# Patient Record
Sex: Male | Born: 1990 | Race: Black or African American | Hispanic: No | Marital: Married | Smoking: Former smoker
Health system: Southern US, Community
[De-identification: ages and names within clinical notes are randomized; demographics above are authoritative.]

## PROBLEM LIST (undated history)

## (undated) DIAGNOSIS — J45909 Unspecified asthma, uncomplicated: Secondary | ICD-10-CM

## (undated) DIAGNOSIS — K219 Gastro-esophageal reflux disease without esophagitis: Secondary | ICD-10-CM

## (undated) DIAGNOSIS — M549 Dorsalgia, unspecified: Secondary | ICD-10-CM

## (undated) DIAGNOSIS — F32A Depression, unspecified: Secondary | ICD-10-CM

## (undated) HISTORY — DX: Depression, unspecified: F32.A

## (undated) HISTORY — PX: EYE SURGERY: SHX253

## (undated) HISTORY — DX: Gastro-esophageal reflux disease without esophagitis: K21.9

---

## 2016-03-02 ENCOUNTER — Encounter (HOSPITAL_COMMUNITY): Payer: Self-pay | Admitting: Emergency Medicine

## 2016-03-02 ENCOUNTER — Emergency Department (HOSPITAL_COMMUNITY)
Admission: EM | Admit: 2016-03-02 | Discharge: 2016-03-02 | Disposition: A | Discharge status: Home / Self Care | Payer: Self-pay | Attending: Emergency Medicine | Admitting: Emergency Medicine

## 2016-03-02 DIAGNOSIS — F17200 Nicotine dependence, unspecified, uncomplicated: Secondary | ICD-10-CM | POA: Insufficient documentation

## 2016-03-02 DIAGNOSIS — J45909 Unspecified asthma, uncomplicated: Secondary | ICD-10-CM | POA: Insufficient documentation

## 2016-03-02 DIAGNOSIS — J020 Streptococcal pharyngitis: Secondary | ICD-10-CM | POA: Insufficient documentation

## 2016-03-02 HISTORY — DX: Unspecified asthma, uncomplicated: J45.909

## 2016-03-02 LAB — RAPID STREP SCREEN (MED CTR MEBANE ONLY): Streptococcus, Group A Screen (Direct): POSITIVE — AB

## 2016-03-02 MED ORDER — IBUPROFEN 400 MG PO TABS
800.0000 mg | ORAL_TABLET | Freq: Once | ORAL | Status: AC
  Administered 2016-03-02: 800 mg via ORAL
  Filled 2016-03-02: qty 2

## 2016-03-02 MED ORDER — PENICILLIN V POTASSIUM 500 MG PO TABS: 500.0000 mg | ORAL_TABLET | Freq: Two times a day (BID) | ORAL | 0 refills | Status: DC

## 2016-03-02 MED ORDER — NAPROXEN 250 MG PO TABS: 250.0000 mg | ORAL_TABLET | Freq: Two times a day (BID) | ORAL | 0 refills | Status: DC

## 2016-03-02 NOTE — ED Provider Notes (Signed)
[facility]-EMERGENCY DEPT Provider Note   CSN: [id number] Arrival date & time: 03/02/16  0854 10:28 AM  First Provider Contact: 9:13 AM   By signing my name below, I, Charline Bills, attest that this documentation has been prepared under the direction and in the presence of Everlene Farrier, PA-C Electronically Signed: Charline Bills, ED Scribe 03/02/2016 at 9:49 AM.  History   Chief Complaint Chief Complaint  Patient presents with  . Sore Throat  . Otalgia  . Migraine   HPI Brett Henry is a 25 y.o. male who presents to the Emergency Department complaining of gradually worsening sore throat onset yesterday. Pt reports increased pain with swallowing. He reports associated symptoms of chills and bilateral ear pain. No treatments tried PTA. Pt denies nasal congestion, cough, postnasal drip, difficulty breathing, trouble swallowing. Pt states that he was diagnosed with strep throat 4 months ago. No recent antibiotic use.    The history is provided by the patient. No language interpreter was used.  Sore Throat  Pertinent negatives include no shortness of breath.   Past Medical History:  Diagnosis Date  . Asthma     There are no active problems to display for this patient.   No past surgical history on file.   Home Medications    Prior to Admission medications   Medication Sig Start Date End Date Taking? Authorizing Provider  naproxen (NAPROSYN) 250 MG tablet Take 1 tablet (250 mg total) by mouth 2 (two) times daily with a meal. 03/02/16   Everlene Farrier, PA-C  penicillin v potassium (VEETID) 500 MG tablet Take 1 tablet (500 mg total) by mouth 2 (two) times daily. 03/02/16   Everlene Farrier, PA-C    Family History No family history on file.  Social History Social History  Substance Use Topics  . Smoking status: Current Every Day Smoker  . Smokeless tobacco: Current User  . Alcohol use Yes   Allergies   Review of patient's allergies indicates not on file.  Review of  Systems Review of Systems  Constitutional: Positive for chills. Negative for fever.  HENT: Positive for ear pain and sore throat. Negative for congestion, drooling, postnasal drip, rhinorrhea, trouble swallowing and voice change.   Eyes: Negative for visual disturbance.  Respiratory: Negative for cough and shortness of breath.   Musculoskeletal: Negative for neck pain and neck stiffness.  Skin: Negative for rash.   Physical Exam Updated Vital Signs BP 110/66 (BP Location: Right Arm)   Pulse 89   Temp 99.9 F (37.7 C) (Oral)   Resp 16   Ht  (1.676 m)   Wt 63.6 kg   SpO2 100%   BMI 22.65 kg/m   Physical Exam  Constitutional: He appears well-developed and well-nourished. No distress.  Nontoxic appearing.  HENT:  Head: Normocephalic and atraumatic.  Right Ear: External ear normal. A middle ear effusion is present.  Left Ear: External ear normal. A middle ear effusion is present.  Mouth/Throat: Uvula is midline. No trismus in the jaw. Oropharyngeal exudate present.  Mild bilateral tonsillar hypertrophy without exudates. Uvula is midline without edema. No drooling. No trismus. No peritonsillar abscess. Tongue protrusion normal Speech is clear and coherent. Mild middle ear effusion bilaterally. No TM erythema or loss of landmarks.   Eyes: Conjunctivae are normal. Pupils are equal, round, and reactive to light. Right eye exhibits no discharge. Left eye exhibits no discharge.  Neck: Normal range of motion. Neck supple. No JVD present. No tracheal deviation present.  Cardiovascular: Normal rate, regular  rhythm, normal heart sounds and intact distal pulses.   Pulmonary/Chest: Effort normal and breath sounds normal. No stridor. No respiratory distress.  Abdominal: Soft. There is no tenderness.  Lymphadenopathy:    He has cervical adenopathy (mild).  Neurological: He is alert. Coordination normal.  Skin: Skin is warm and dry. No rash noted. He is not diaphoretic.  Psychiatric: He  has a normal mood and affect. His behavior is normal.  Nursing note and vitals reviewed.  ED Treatments / Results  Labs (all labs ordered are listed, but only abnormal results are displayed) Labs Reviewed  RAPID STREP SCREEN (NOT AT [facility]) - Abnormal; Notable for the following:       Result Value   Streptococcus, Group A Screen (Direct) POSITIVE (*)    All other components within normal limits   EKG  EKG Interpretation None       Radiology No results found.  Procedures Procedures (including critical care time) DIAGNOSTIC STUDIES: Oxygen Saturation is 100% on RA, normal by my interpretation.    COORDINATION OF CARE: 9:22 AM-Discussed treatment plan which includes strep screen and ibuprofen with pt at bedside and pt agreed to plan.   Medications Ordered in ED Medications  ibuprofen (ADVIL,MOTRIN) tablet 800 mg (800 mg Oral Given 03/02/16 0949)    Initial Impression / Assessment and Plan / ED Course  I have reviewed the triage vital signs and the nursing notes.  Pertinent labs & imaging results that were available during my care of the patient were reviewed by me and considered in my medical decision making (see chart for details).  Clinical Course   On exam patient is afebrile and nontoxic appearing. He has mild bilateral tonsillar hypertrophy without exudates. No peritonsillar abscess. No trismus no drooling. He is tolerating by mouth without difficulty. Strep test is positive. Will treat with antibiotics per patient requested oral antibiotics. Would she with Penicillin VK and naproxen. I discussed return precautions. I advised the patient to follow-up with their primary care provider this week. I advised the patient to return to the emergency department with new or worsening symptoms or new concerns. The patient verbalized understanding and agreement with plan.    I personally performed the services described in this documentation, which was scribed in my presence. The  recorded information has been reviewed and is accurate.      Final Clinical Impressions(s) / ED Diagnoses   Final diagnoses:  Strep pharyngitis    New Prescriptions Discharge Medication List as of 03/02/2016  9:50 AM    START taking these medications   Details  naproxen (NAPROSYN) 250 MG tablet Take 1 tablet (250 mg total) by mouth 2 (two) times daily with a meal., Starting Mon 03/02/2016, Print    penicillin v potassium (VEETID) 500 MG tablet Take 1 tablet (500 mg total) by mouth 2 (two) times daily., Starting Mon 03/02/2016, Print         Everlene Farrier, PA-C 03/02/16 1029    Rolan Bucco, MD 03/02/16 1531

## 2016-03-02 NOTE — ED Triage Notes (Signed)
Pt. Stated, I have a sore throat and earache since yesterday.

## 2016-03-31 ENCOUNTER — Encounter (HOSPITAL_COMMUNITY): Payer: Self-pay | Admitting: Emergency Medicine

## 2016-03-31 ENCOUNTER — Ambulatory Visit (HOSPITAL_COMMUNITY)
Admission: EM | Admit: 2016-03-31 | Discharge: 2016-03-31 | Disposition: A | Discharge status: Home / Self Care | Payer: Self-pay | Attending: Emergency Medicine | Admitting: Emergency Medicine

## 2016-03-31 DIAGNOSIS — R1084 Generalized abdominal pain: Secondary | ICD-10-CM

## 2016-03-31 DIAGNOSIS — T679XXA Effect of heat and light, unspecified, initial encounter: Secondary | ICD-10-CM

## 2016-03-31 DIAGNOSIS — R112 Nausea with vomiting, unspecified: Secondary | ICD-10-CM

## 2016-03-31 LAB — POCT I-STAT, CHEM 8
BUN: 9 mg/dL (ref 6–20)
CALCIUM ION: 1.19 mmol/L (ref 1.13–1.30)
Chloride: 104 mmol/L (ref 101–111)
Creatinine, Ser: 1 mg/dL (ref 0.61–1.24)
Glucose, Bld: 88 mg/dL (ref 65–99)
HEMATOCRIT: 44 % (ref 39.0–52.0)
HEMOGLOBIN: 15 g/dL (ref 13.0–17.0)
POTASSIUM: 4.3 mmol/L (ref 3.5–5.1)
Sodium: 141 mmol/L (ref 135–145)
TCO2: 24 mmol/L (ref 0–100)

## 2016-03-31 MED ORDER — ONDANSETRON HCL 4 MG/2ML IJ SOLN
4.0000 mg | Freq: Once | INTRAMUSCULAR | Status: AC
  Administered 2016-03-31: 4 mg via INTRAMUSCULAR

## 2016-03-31 MED ORDER — ONDANSETRON HCL 4 MG PO TABS: 4.0000 mg | ORAL_TABLET | Freq: Four times a day (QID) | ORAL | 0 refills | Status: DC

## 2016-03-31 MED ORDER — ONDANSETRON HCL 4 MG/2ML IJ SOLN
INTRAMUSCULAR | Status: AC
  Filled 2016-03-31: qty 2

## 2016-03-31 NOTE — ED Provider Notes (Signed)
CSN: [id number]     Arrival date & time 03/31/16  1553 History   First MD Initiated Contact with Patient 03/31/16 1630     Chief Complaint  Patient presents with  . 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 Over the next 3 hours he developed some dizziness as well. He vomited a second time around 12:30. No vomiting since. He is complaining of abdominal pain, intermittent dizziness and nausea and vomiting. He states he attempted to drink some water prior to coming to the urgent care but could not hold down despite the fact that he said he had no vomiting at that time.      Past Medical History:  Diagnosis Date  . Asthma    Past Surgical History:  Procedure Laterality Date  . EYE SURGERY     History reviewed. No pertinent family history. Social History  Substance Use Topics  . Smoking status: Current Every Day Smoker    Packs/day: 1.00    Types: Cigarettes  . Smokeless tobacco: Current User  . Alcohol use Yes     Comment: occasional    Review of Systems  Constitutional: Positive for activity change and fatigue. Negative for fever.  HENT: Negative.   Eyes: Negative.   Respiratory: Negative for cough, shortness of breath and wheezing.   Cardiovascular: Negative for chest pain.  Gastrointestinal: Positive for abdominal pain.       Patient draws a circle around the entire mid abdomen indicating the area of abdominal discomfort.  Genitourinary: Negative.   Musculoskeletal: Negative.   Neurological: Negative.   All other systems reviewed and are negative.   Allergies  Tylenol [acetaminophen]  Home Medications   Prior to Admission medications   Medication Sig Start Date End Date Taking? Authorizing Provider  naproxen (NAPROSYN) 250 MG tablet Take 1 tablet (250 mg total) by mouth  2 (two) times daily with a meal. 03/02/16   Everlene FarrierWilliam Dansie, PA-C  ondansetron (ZOFRAN) 4 MG tablet Take 1 tablet (4 mg total) by mouth every 6 (six) hours. 03/31/16   Hayden Rasmussenavid Cheyan Frees, NP   Meds Ordered and Administered this Visit   Medications  ondansetron [facility](ZOFRAN) injection 4 mg (4 mg Intramuscular Given 03/31/16 1703)    BP 125/67 (BP Location: Left Arm)   Pulse 69   Temp 98.6 F (37 C) (Oral)   Resp 16   Ht 5\' 6"  (1.676 m)   Wt 135 lb (61.2 kg)   SpO2 99%   BMI 21.79 kg/m  No data found.   Physical Exam  Constitutional: He is oriented to person, place, and time. He appears well-developed and well-nourished. No distress.  Eyes: EOM are normal.  Neck: Normal range of motion.  Cardiovascular: Normal rate, regular rhythm and normal heart sounds.   Pulmonary/Chest: Effort normal and breath sounds normal. No respiratory distress. He has no wheezes. He has no rales.  Abdominal: Soft. Bowel sounds are normal. He exhibits no distension and no mass. There is no tenderness. There is no rebound.  No tenderness however the patient tends to contract his abdominal muscles during the exam. Palpation of the muscles themselves produces abdominal wall pain.  Musculoskeletal: Normal range of motion.  Lymphadenopathy:    He has no cervical adenopathy.  Neurological: He is alert and oriented to person, place, and time.  Skin: Skin is warm and dry. No rash noted.  Psychiatric: He has a normal mood and affect.  Nursing note and vitals reviewed.   Urgent Care Course   Clinical Course   Results for orders placed or performed during the hospital encounter of 03/31/16  I-STAT, chem 8  Result Value Ref Range   Sodium 141 135 - 145 mmol/L   Potassium 4.3 3.5 - 5.1 mmol/L   Chloride 104 101 - 111 mmol/L   BUN 9 6 - 20 mg/dL   Creatinine, Ser 4.091.00 0.61 - 1.24 mg/dL   Glucose, Bld 88 65 - 99 mg/dL   Calcium, Ion 8.111.19 9.141.13 - 1.30 mmol/L   TCO2 24 0 - 100 mmol/L   Hemoglobin 15.0 13.0 - 17.0 g/dL    HCT 78.244.0 95.639.0 - 21.352.0 %    Procedures (including critical care time)  Labs Review Labs Reviewed  POCT I-STAT, CHEM 8    Imaging Review No results found.   Visual Acuity Review  Right Eye Distance:   Left Eye Distance:   Bilateral Distance:    Right Eye Near:   Left Eye Near:    Bilateral Near:         MDM   1. Heat effect, initial encounter   2. Non-intractable vomiting with nausea, vomiting of unspecified type   3. Generalized abdominal pain    Stain: Environment. Drink lots of fluids, add Gatorade or Pedialyte. Zofran as needed for nausea and vomiting. Clear liquids for the next 12 hours or so. Recheck promptly if worse. Follow-up with your primary care doctor. Meds ordered this encounter  Medications  . ondansetron (ZOFRAN) injection 4 mg  . ondansetron (ZOFRAN) 4 MG tablet    Sig: Take 1 tablet (4 mg total) by mouth every 6 (six) hours.    Dispense:  12 tablet    Refill:  0    Order Specific Question:   Supervising Provider    Answer:   Linna HoffKINDL, JAMES D [[id number]]       Hayden Rasmussenavid Timofey Carandang, NP 03/31/16 1735

## 2016-03-31 NOTE — Discharge Instructions (Signed)
Lots of water and clear liquids, stay well hydrated.Add Gatorade or Pedialyte to drink.

## 2016-03-31 NOTE — ED Triage Notes (Signed)
Pt reports two episodes of vomiting at work today. He works in a [employer], but states he was drinking water.  He still feels nauseous and has some stomach cramping.

## 2016-04-09 ENCOUNTER — Emergency Department (HOSPITAL_COMMUNITY): Payer: Self-pay

## 2016-04-09 ENCOUNTER — Encounter (HOSPITAL_COMMUNITY): Payer: Self-pay

## 2016-04-09 ENCOUNTER — Emergency Department (HOSPITAL_COMMUNITY)
Admission: EM | Admit: 2016-04-09 | Discharge: 2016-04-09 | Disposition: A | Discharge status: Home / Self Care | Payer: Self-pay | Attending: Emergency Medicine | Admitting: Emergency Medicine

## 2016-04-09 DIAGNOSIS — X500XXA Overexertion from strenuous movement or load, initial encounter: Secondary | ICD-10-CM | POA: Insufficient documentation

## 2016-04-09 DIAGNOSIS — Y9259 Other trade areas as the place of occurrence of the external cause: Secondary | ICD-10-CM | POA: Insufficient documentation

## 2016-04-09 DIAGNOSIS — Y990 Civilian activity done for income or pay: Secondary | ICD-10-CM | POA: Insufficient documentation

## 2016-04-09 DIAGNOSIS — F17210 Nicotine dependence, cigarettes, uncomplicated: Secondary | ICD-10-CM | POA: Insufficient documentation

## 2016-04-09 DIAGNOSIS — Y9389 Activity, other specified: Secondary | ICD-10-CM | POA: Insufficient documentation

## 2016-04-09 DIAGNOSIS — M25561 Pain in right knee: Secondary | ICD-10-CM | POA: Insufficient documentation

## 2016-04-09 DIAGNOSIS — J45909 Unspecified asthma, uncomplicated: Secondary | ICD-10-CM | POA: Insufficient documentation

## 2016-04-09 MED ORDER — NAPROXEN 500 MG PO TABS: 500.0000 mg | ORAL_TABLET | Freq: Two times a day (BID) | ORAL | 0 refills | Status: DC

## 2016-04-09 MED ORDER — KETOROLAC TROMETHAMINE 30 MG/ML IJ SOLN
30.0000 mg | Freq: Once | INTRAMUSCULAR | Status: AC
  Administered 2016-04-09: 30 mg via INTRAMUSCULAR
  Filled 2016-04-09: qty 1

## 2016-04-09 NOTE — ED Triage Notes (Signed)
Pt. sts that his leg started hurting yesterday at work and is sore and throbbing on the outer back part of the knee. Pt. Has full range of motion but it is painful

## 2016-04-09 NOTE — ED Notes (Signed)
Pt. Leaving with significant other in minimal pain and denies any more questions

## 2016-04-09 NOTE — Discharge Instructions (Signed)
Your x-ray was normal today. Take naproxen as needed for pain. Keep your knee elevated and ice on and off for the next 48 hrs. Return to work next week as scheduled. Follow up with ortho as needed.

## 2016-04-09 NOTE — ED Provider Notes (Signed)
[facility] DEPT Provider Note   CSN: [id number] Arrival date & time: 04/09/16  4098  By signing my name below, I, Brett Henry, attest that this documentation has been prepared under the direction and in the presence of Lorenzo Arscott, [place]. Electronically Signed: Christel Henry, Scribe. 04/09/2016. 9:38 AM.    History   Chief Complaint Chief Complaint  Patient presents with  . Leg Pain    The history is provided by the patient. No language interpreter was used.   HPI Comments:  Brett Henry is a 25 y.o. male who presents to the Emergency Department complaining of sudden onset, constant leg pain beginning at 4:30PM yesterday. Pt reports that yesterday at work, he felt a "sore, sharp and throbbing" pain the posterolateral  aspect of R knee particularly lateral side. Pt describes the pain as 7/10. Pt states that his R knee becomes weak when walking. 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 Pt denies numbness or weakness. Has not tried anything for his pain.  Past Medical History:  Diagnosis Date  . Asthma     There are no active problems to display for this patient.  Past Surgical History:  Procedure Laterality Date  . EYE SURGERY      Home Medications    Prior to Admission medications   Medication Sig Start Date End Date Taking? Authorizing Provider  naproxen (NAPROSYN) 250 MG tablet Take 1 tablet (250 mg total) by mouth 2 (two) times daily with a meal. 03/02/16   Everlene Farrier, PA-C  ondansetron (ZOFRAN) 4 MG tablet Take 1 tablet (4 mg total) by mouth every 6 (six) hours. 03/31/16   Hayden Rasmussen, NP   Family History No family history on file.  Social History Social History  Substance Use Topics  . Smoking status: Current Every Day Smoker    Packs/day: 1.00    Types: Cigarettes  . Smokeless tobacco: Current User  . Alcohol use Yes     Comment: occasional   Allergies   Tylenol [acetaminophen]  Review of Systems Review of Systems 10 Systems  reviewed and are negative for acute change except as noted in the HPI.  Physical Exam Updated Vital Signs BP 122/69 (BP Location: Right Arm)   Pulse 74   Temp 98.2 F (36.8 C) (Oral)   Resp 19   Ht 5\' 6"  (1.676 m)   Wt 140 lb (63.5 kg)   SpO2 100%   BMI 22.60 kg/m   Physical Exam  Constitutional: He appears well-developed and well-nourished. No distress.  HENT:  Head: Normocephalic and atraumatic.  Eyes: Conjunctivae are normal.  Cardiovascular: Normal rate.   Pulmonary/Chest: Effort normal.  Abdominal: He exhibits no distension.  Musculoskeletal:  Right lateral knee point tender to palpation. No edema or discoloration. FROM of knee. No laxity or crepitus. Ambulatory with steady gait. 2+ DP, and PT  Neurological: He is alert.  Skin: Skin is warm and dry.  Psychiatric: He has a normal mood and affect.  Nursing note and vitals reviewed.    ED Treatments / Results  DIAGNOSTIC STUDIES:  Oxygen Saturation is 100% on RA, normal by my interpretation.    COORDINATION OF CARE:  9:38 AM Will give injection for knee pain. Discussed treatment plan with pt at bedside and pt agreed to plan.  Labs (all labs ordered are listed, but only abnormal results are displayed) Labs Reviewed - No data to display  EKG  EKG Interpretation None       Radiology Dg Knee Complete 4 Views  Right  Result Date: 04/09/2016 CLINICAL DATA:  Knee pain.  No known injury.  Initial evaluation EXAM: RIGHT KNEE - COMPLETE 4+ VIEW COMPARISON:  No recent prior . FINDINGS: No acute bony or joint abnormality identified. No evidence of fracture dislocation. IMPRESSION: No acute or focal abnormality. Electronically Signed   By: Maisie Fushomas  Register   On: 04/09/2016 10:18    Procedures Procedures (including critical care time)  Medications Ordered in ED Medications  ketorolac (TORADOL) 30 MG/ML injection 30 mg (30 mg Intramuscular Given 04/09/16 0954)     Initial Impression / Assessment and Plan / ED  Course   I have reviewed the triage vital signs and the nursing notes.  Pertinent labs & imaging results that were available during my care of the patient were reviewed by me and considered in my medical decision making (see chart for details).  Clinical Course   Pt requesting x-ray. Suspect overuse injury. Will treat with toradol.   XR negative. Doubt septic joint without fever, erythema, or edema. Rx given for naproxen. Encouraged RICE therapy. F/u with ortho as needed. ER return precautions given.  Final Clinical Impressions(s) / ED Diagnoses   Final diagnoses:  Right knee pain    New Prescriptions Discharge Medication List as of 04/09/2016 10:26 AM    START taking these medications   Details  !! naproxen (NAPROSYN) 500 MG tablet Take 1 tablet (500 mg total) by mouth 2 (two) times daily., Starting Thu 04/09/2016, Print     !! - Potential duplicate medications found. Please discuss with provider.     I personally performed the services described in this documentation, which was scribed in my presence. The recorded information has been reviewed and is accurate.   Carlene CoriaSerena Y Kc Summerson, PA-C 04/09/16 1337    Vanetta MuldersScott Zackowski, MD 04/10/16 [phone]

## 2016-04-09 NOTE — ED Notes (Signed)
Patient transported to X-ray 

## 2016-04-21 ENCOUNTER — Encounter (HOSPITAL_COMMUNITY): Payer: Self-pay | Admitting: Emergency Medicine

## 2016-04-21 ENCOUNTER — Ambulatory Visit (HOSPITAL_COMMUNITY)
Admission: EM | Admit: 2016-04-21 | Discharge: 2016-04-21 | Disposition: A | Discharge status: Home / Self Care | Payer: Medicaid Other | Attending: Family Medicine | Admitting: Family Medicine

## 2016-04-21 DIAGNOSIS — R197 Diarrhea, unspecified: Secondary | ICD-10-CM

## 2016-04-21 DIAGNOSIS — K529 Noninfective gastroenteritis and colitis, unspecified: Secondary | ICD-10-CM

## 2016-04-21 DIAGNOSIS — R112 Nausea with vomiting, unspecified: Secondary | ICD-10-CM

## 2016-04-21 MED ORDER — PROMETHAZINE HCL 25 MG PO TABS: 25.0000 mg | ORAL_TABLET | Freq: Four times a day (QID) | ORAL | 0 refills | Status: DC | PRN

## 2016-04-21 NOTE — Discharge Instructions (Signed)
Push fluids.  Gatorade etc.   Do not eat meal with lots of fats or grease

## 2016-04-21 NOTE — ED Triage Notes (Signed)
[facility] a [facility] with abdominal pain that has been intermittent for 3 days.

## 2016-04-21 NOTE — ED Provider Notes (Signed)
CSN: [id number]     Arrival date & time 04/21/16  1317 History   First MD Initiated Contact with Patient 04/21/16 1503     Chief Complaint  Patient presents with  . Abdominal Pain   (Consider location/radiation/quality/duration/timing/severity/associated sxs/prior Treatment) HPI 25 y/o male with onset of n/v/d Sunday after eating chicken wings. Vomiting & diarrhea 4-5 times a day. Used 1 zofran without change of symptoms.  Past Medical History:  Diagnosis Date  . Asthma    Past Surgical History:  Procedure Laterality Date  . EYE SURGERY     History reviewed. No pertinent family history. Social History  Substance Use Topics  . Smoking status: Current Every Day Smoker    Packs/day: 1.00    Types: Cigarettes  . Smokeless tobacco: Current User  . Alcohol use Yes     Comment: occasional    Review of Systems  Denies: HEADACHE,   CHEST PAIN, CONGESTION, DYSURIA, SHORTNESS OF BREATH  Allergies  Tylenol [acetaminophen]  Home Medications   Prior to Admission medications   Medication Sig Start Date End Date Taking? Authorizing Provider  ondansetron (ZOFRAN) 4 MG tablet Take 1 tablet (4 mg total) by mouth every 6 (six) hours. 03/31/16  Yes Hayden Rasmussenavid Mabe, NP  naproxen (NAPROSYN) 250 MG tablet Take 1 tablet (250 mg total) by mouth 2 (two) times daily with a meal. 03/02/16   Everlene FarrierWilliam Dansie, PA-C  naproxen (NAPROSYN) 500 MG tablet Take 1 tablet (500 mg total) by mouth 2 (two) times daily. 04/09/16   Carlene CoriaSerena Y Sam, PA-C  promethazine (PHENERGAN) 25 MG tablet Take 1 tablet (25 mg total) by mouth every 6 (six) hours as needed for nausea or vomiting. 04/21/16   Tharon AquasFrank C Dmarcus Decicco, PA   Meds Ordered and Administered this Visit  Medications - No data to display  BP 110/68 (BP Location: Left Arm)   Pulse (!) 57   Temp 98.2 F (36.8 C) (Oral)   Resp 16   SpO2 100%  No data found.   Physical Exam NURSES NOTES AND VITAL SIGNS REVIEWED. CONSTITUTIONAL: Well developed, well nourished, no acute  distress HEENT: normocephalic, atraumatic EYES: Conjunctiva normal NECK:normal ROM, supple, no adenopathy PULMONARY:No respiratory distress, normal effort ABDOMINAL: Soft, ND, NT BS+, No CVAT MUSCULOSKELETAL: Normal ROM of all extremities,  SKIN: warm and dry without rash PSYCHIATRIC: Mood and affect, behavior are normal  Urgent Care Course   Clinical Course    Procedures (including critical care time)  Labs Review Labs Reviewed - No data to display  Imaging Review No results found.   Visual Acuity Review  Right Eye Distance:   Left Eye Distance:   Bilateral Distance:    Right Eye Near:   Left Eye Near:    Bilateral Near:         MDM   1. Gastroenteritis   2. Nausea vomiting and diarrhea     Patient is reassured that there are no issues that require transfer to higher level of care at this time or additional tests. Patient is advised to continue home symptomatic treatment. Patient is advised that if there are new or worsening symptoms to attend the emergency department, contact primary care provider, or return to UC. Instructions of care provided discharged home in stable condition.    THIS NOTE WAS GENERATED USING A VOICE RECOGNITION SOFTWARE PROGRAM. ALL REASONABLE EFFORTS  WERE MADE TO PROOFREAD THIS DOCUMENT FOR ACCURACY.  I have verbally reviewed the discharge instructions with the patient. A printed AVS was given to  the patient.  All questions were answered prior to discharge.      Tharon Aquas, PA 04/21/16 2031

## 2016-10-30 ENCOUNTER — Emergency Department (HOSPITAL_COMMUNITY)
Admission: EM | Admit: 2016-10-30 | Discharge: 2016-10-30 | Disposition: A | Discharge status: Home / Self Care | Payer: Self-pay | Attending: Emergency Medicine | Admitting: Emergency Medicine

## 2016-10-30 ENCOUNTER — Emergency Department (HOSPITAL_COMMUNITY): Payer: Self-pay

## 2016-10-30 ENCOUNTER — Encounter (HOSPITAL_COMMUNITY): Payer: Self-pay | Admitting: Emergency Medicine

## 2016-10-30 DIAGNOSIS — Y939 Activity, unspecified: Secondary | ICD-10-CM | POA: Insufficient documentation

## 2016-10-30 DIAGNOSIS — Y929 Unspecified place or not applicable: Secondary | ICD-10-CM | POA: Insufficient documentation

## 2016-10-30 DIAGNOSIS — Z79899 Other long term (current) drug therapy: Secondary | ICD-10-CM | POA: Insufficient documentation

## 2016-10-30 DIAGNOSIS — J45909 Unspecified asthma, uncomplicated: Secondary | ICD-10-CM | POA: Insufficient documentation

## 2016-10-30 DIAGNOSIS — F17210 Nicotine dependence, cigarettes, uncomplicated: Secondary | ICD-10-CM | POA: Insufficient documentation

## 2016-10-30 DIAGNOSIS — X500XXA Overexertion from strenuous movement or load, initial encounter: Secondary | ICD-10-CM | POA: Insufficient documentation

## 2016-10-30 DIAGNOSIS — M25512 Pain in left shoulder: Secondary | ICD-10-CM | POA: Insufficient documentation

## 2016-10-30 DIAGNOSIS — Y990 Civilian activity done for income or pay: Secondary | ICD-10-CM | POA: Insufficient documentation

## 2016-10-30 IMAGING — DX DG SHOULDER 2+V*L*
3 series · 3 of 3 positions shown · non-contrast
Comparison: None.

CLINICAL DATA: Left shoulder pain for several days, no known
injury, initial encounter

EXAM:
LEFT SHOULDER - 2+ VIEW

[w shoulder internal left]
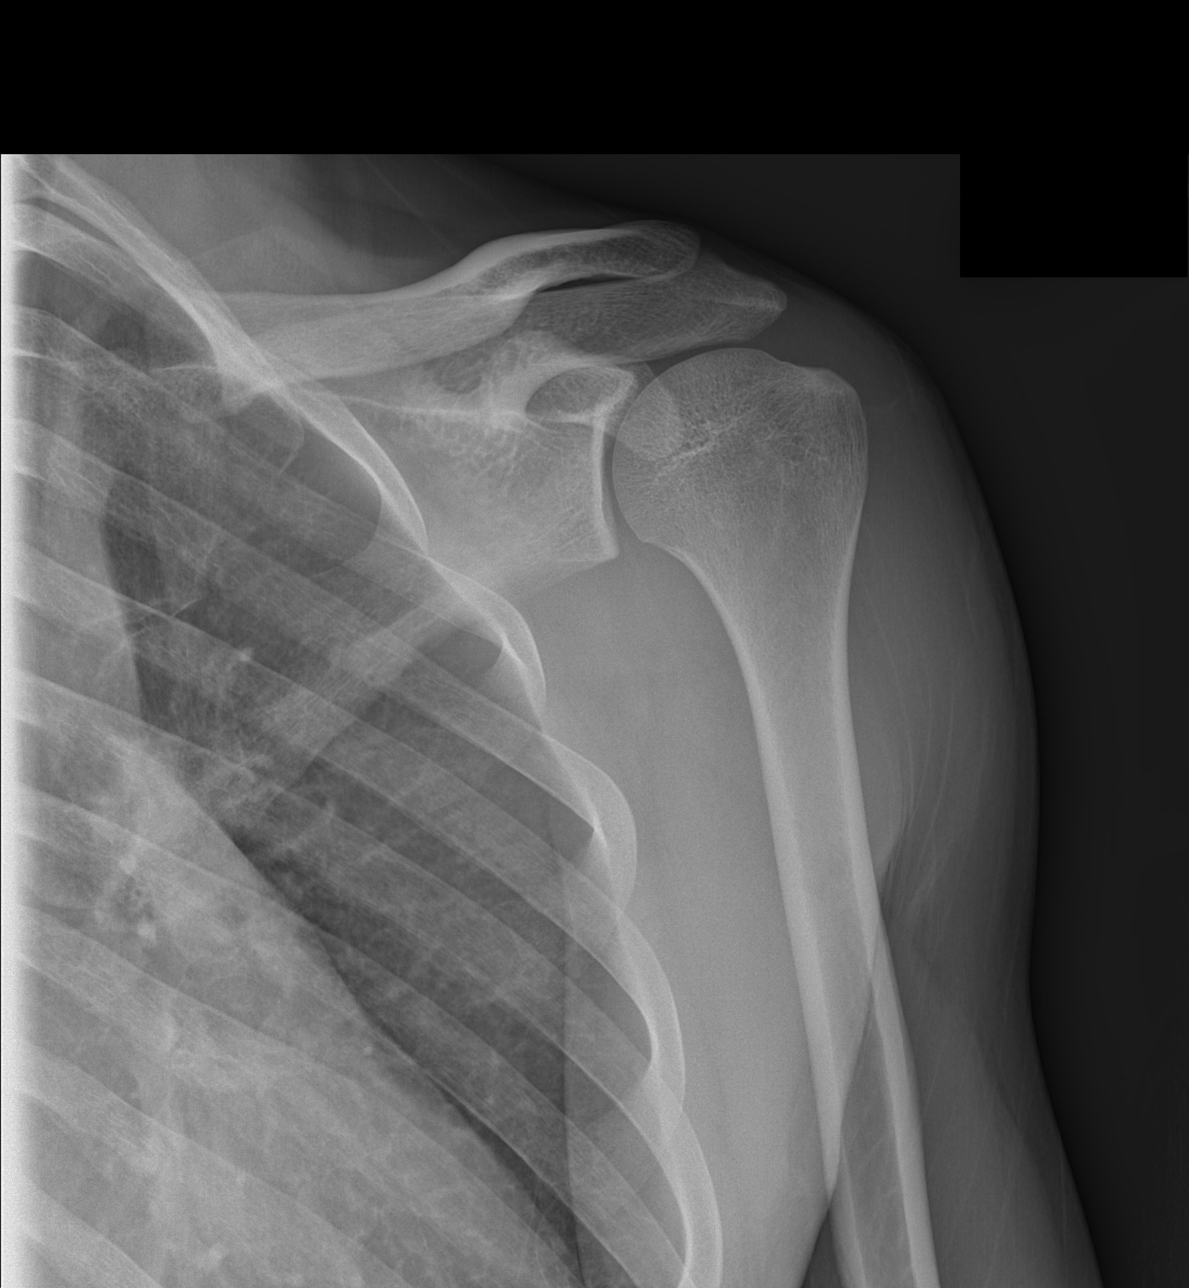

[w shoulder y-view left]
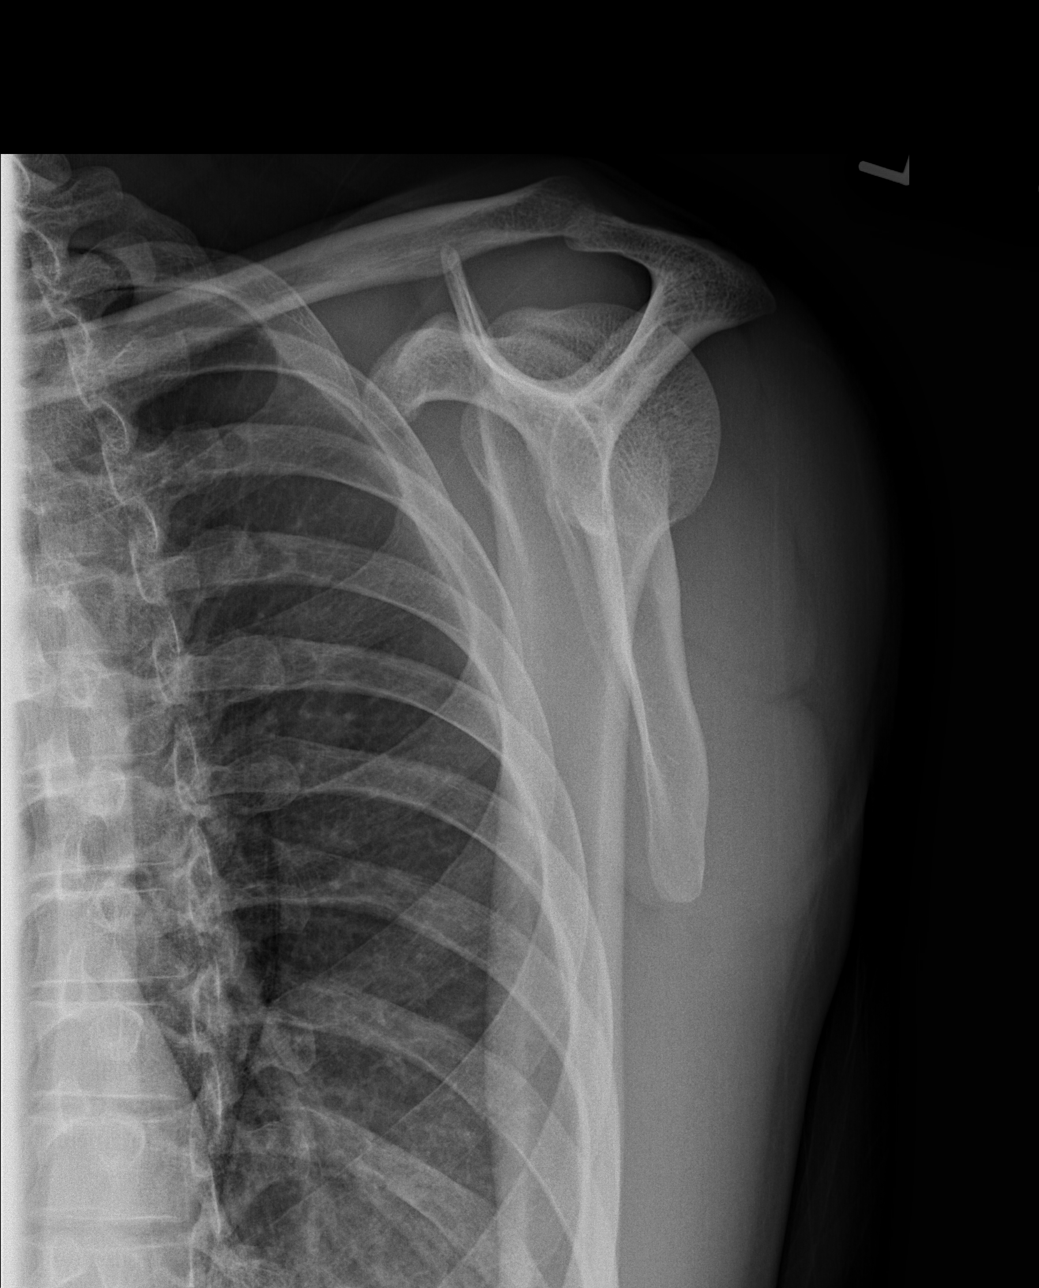

[x shoulder axillary left]
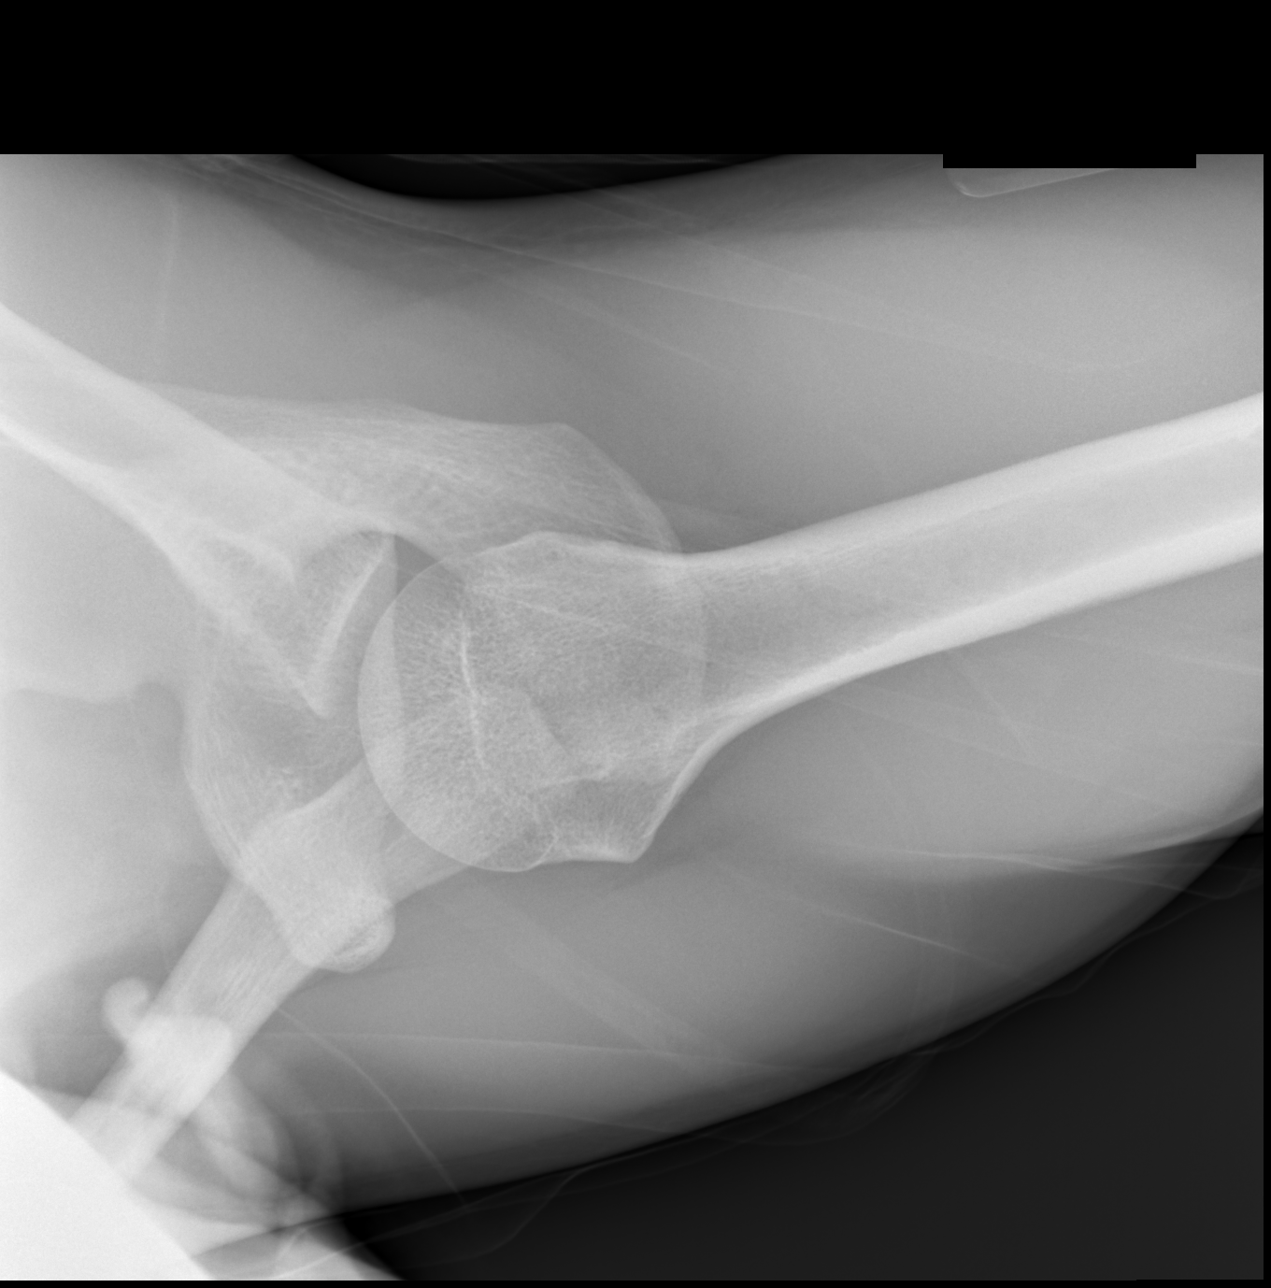

[3 of 3 positions shown; findings below may reference images not displayed]

FINDINGS: There is no evidence of fracture or dislocation. There is no
evidence of arthropathy or other focal bone abnormality. Soft
tissues are unremarkable.
IMPRESSION: No acute abnormality noted.

## 2016-10-30 MED ORDER — IBUPROFEN 400 MG PO TABS
600.0000 mg | ORAL_TABLET | Freq: Once | ORAL | Status: AC
  Administered 2016-10-30: 600 mg via ORAL
  Filled 2016-10-30: qty 1

## 2016-10-30 MED ORDER — IBUPROFEN 600 MG PO TABS: 600.0000 mg | ORAL_TABLET | Freq: Four times a day (QID) | ORAL | 0 refills | Status: DC | PRN

## 2016-10-30 NOTE — Discharge Instructions (Signed)
Take your medication as prescribed as needed for pain relief. I also recommend applying ice to affected area for 15-20 minutes 3-4 times daily. I recommend refraining from doing any overhead lifting or strenuous activity for the next few days until her symptoms have improved. I recommend following up with the orthopedic office of below if your symptoms have not improved over the next week. Return to the emergency department if symptoms worsen or new onset of fever, swelling, neck pain/stiffness, numbness, tingling, weakness.

## 2016-10-30 NOTE — ED Triage Notes (Signed)
Pt states his left shoulder has been hurting for 2 days. States he has limited motion due to pain. Pt cannot recall injuring it.

## 2016-10-30 NOTE — ED Provider Notes (Signed)
[facility]-EMERGENCY DEPT Provider Note   CSN: [id number] Arrival date & time: 10/30/16  [id number] By signing my name below, I, Bridgette HabermannMaria Tan, attest that this documentation has been prepared under the direction and in the presence of Melburn HakeNicole Lemma [place], [place]. Electronically Signed: Bridgette HabermannMaria Tan, ED Scribe. 10/30/16. 9:28 AM.  History   Chief Complaint Chief Complaint  Patient presents with  . Shoulder Pain    HPI The history is provided by the patient. No language interpreter was used.   HPI Comments: Lydia Guilesntonio Cramer is a 26 y.o. male with h/o asthma, who is right-hand dominant, presents to the Emergency Department complaining of left shoulder pain beginning 3 days ago. Pt reports pain is tingling/sharp in quality. He denies any injury or trauma to the area but notes he does a lot of heavy lifting/repetitive movement at work. Pt states that pain is exacerbated with movement and straightening his arm out. No noted treatments tried PTA. Pt denies fever, chills, neck pain/stiffness, swelling, rash, weakness, or any other associated symptoms.  Past Medical History:  Diagnosis Date  . Asthma     There are no active problems to display for this patient.   Past Surgical History:  Procedure Laterality Date  . EYE SURGERY         Home Medications    Prior to Admission medications   Medication Sig Start Date End Date Taking? Authorizing Provider  ibuprofen (ADVIL,MOTRIN) 600 MG tablet Take 1 tablet (600 mg total) by mouth every 6 (six) hours as needed. 10/30/16   Barrett HenleNicole Elizabeth Illyana Schorsch, PA-C  naproxen (NAPROSYN) 250 MG tablet Take 1 tablet (250 mg total) by mouth 2 (two) times daily with a meal. 03/02/16   Everlene FarrierWilliam Dansie, PA-C  naproxen (NAPROSYN) 500 MG tablet Take 1 tablet (500 mg total) by mouth 2 (two) times daily. 04/09/16   Ace GinsSerena Y Sam, PA-C  ondansetron (ZOFRAN) 4 MG tablet Take 1 tablet (4 mg total) by mouth every 6 (six) hours. 03/31/16   Hayden Rasmussenavid Mabe, NP  promethazine (PHENERGAN) 25 MG tablet  Take 1 tablet (25 mg total) by mouth every 6 (six) hours as needed for nausea or vomiting. 04/21/16   Tharon AquasFrank C Patrick, PA    Family History History reviewed. No pertinent family history.  Social History Social History  Substance Use Topics  . Smoking status: Current Every Day Smoker    Packs/day: 1.00    Types: Cigarettes  . Smokeless tobacco: Current User  . Alcohol use Yes     Comment: occasional     Allergies   Tylenol [acetaminophen]   Review of Systems Review of Systems  Constitutional: Negative for chills and fever.  Musculoskeletal: Positive for arthralgias. Negative for neck pain and neck stiffness.  Neurological: Negative for numbness.  All other systems reviewed and are negative.    Physical Exam Updated Vital Signs BP 117/64 (BP Location: Left Arm)   Pulse 63   Temp 98.6 F (37 C) (Oral)   Resp 15   SpO2 100%   Physical Exam  Constitutional: He is oriented to person, place, and time. He appears well-developed and well-nourished.  HENT:  Head: Normocephalic and atraumatic.  Eyes: Conjunctivae and EOM are normal. Right eye exhibits no discharge. Left eye exhibits no discharge. No scleral icterus.  Neck: Normal range of motion. Neck supple.  Cardiovascular: Normal rate, regular rhythm, normal heart sounds and intact distal pulses.   Pulmonary/Chest: Effort normal and breath sounds normal. No respiratory distress. He has no wheezes. He has no rales.  Abdominal: He exhibits no distension.  Musculoskeletal: Normal range of motion. He exhibits tenderness. He exhibits no edema.  No midline C, T, or L tenderness. Full range of motion of neck and back. Full range of motion of bilateral upper and lower extremities, with 5/5 strength. Sensation intact. 2+ radial and PT pulses. Cap refill <2 seconds. Patient able to stand and ambulate without assistance.  Mild tenderness over left deltoid. Full ROM over left shoulder, elbow, forearm, and hand with 5/5 strength. No  swelling, erythema, warmth, abrasion, or rash present.  Neurological: He is alert and oriented to person, place, and time.  Skin: Skin is warm and dry. Capillary refill takes less than 2 seconds. No rash noted. No erythema.  Psychiatric: He has a normal mood and affect. His behavior is normal.  Nursing note and vitals reviewed.    ED Treatments / Results  DIAGNOSTIC STUDIES: Oxygen Saturation is 100% on RA, normal by my interpretation.    COORDINATION OF CARE: 9:26 AM Discussed treatment plan with pt at bedside and pt agreed to plan.  Labs (all labs ordered are listed, but only abnormal results are displayed) Labs Reviewed - No data to display  EKG  EKG Interpretation None       Radiology Dg Shoulder Left  Result Date: 10/30/2016 CLINICAL DATA:  Left shoulder pain for several days, no known injury, initial encounter EXAM: LEFT SHOULDER - 2+ VIEW COMPARISON:  None. FINDINGS: There is no evidence of fracture or dislocation. There is no evidence of arthropathy or other focal bone abnormality. Soft tissues are unremarkable. IMPRESSION: No acute abnormality noted. Electronically Signed   By: Alcide Clever M.D.   On: 10/30/2016 09:10    Procedures Procedures (including critical care time)  Medications Ordered in ED Medications  ibuprofen (ADVIL,MOTRIN) tablet 600 mg (not administered)     Initial Impression / Assessment and Plan / ED Course  I have reviewed the triage vital signs and the nursing notes.  Pertinent labs & imaging results that were available during my care of the patient were reviewed by me and considered in my medical decision making (see chart for details).     Patient X-Ray negative for obvious fracture or dislocation. Pain managed in ED. Pt advised to follow up with orthopedics if symptoms persist for possibility of missed fracture diagnosis. Conservative therapy recommended and discussed. Patient will be dc home & is agreeable with above plan. Discussed  return precautions.  Final Clinical Impressions(s) / ED Diagnoses   Final diagnoses:  Acute pain of left shoulder    New Prescriptions New Prescriptions   IBUPROFEN (ADVIL,MOTRIN) 600 MG TABLET    Take 1 tablet (600 mg total) by mouth every 6 (six) hours as needed.   I personally performed the services described in this documentation, which was scribed in my presence. The recorded information has been reviewed and is accurate.     Satira Sark [place], [place] 10/30/16 1914    Melene Plan, DO 10/30/16 [phone]

## 2017-03-28 ENCOUNTER — Encounter (HOSPITAL_COMMUNITY): Payer: Self-pay | Admitting: *Deleted

## 2017-03-28 ENCOUNTER — Emergency Department (HOSPITAL_COMMUNITY)
Admission: EM | Admit: 2017-03-28 | Discharge: 2017-03-28 | Disposition: A | Discharge status: Home / Self Care | Payer: Medicaid Other | Attending: Emergency Medicine | Admitting: Emergency Medicine

## 2017-03-28 DIAGNOSIS — J45909 Unspecified asthma, uncomplicated: Secondary | ICD-10-CM | POA: Insufficient documentation

## 2017-03-28 DIAGNOSIS — J029 Acute pharyngitis, unspecified: Secondary | ICD-10-CM | POA: Insufficient documentation

## 2017-03-28 DIAGNOSIS — F17210 Nicotine dependence, cigarettes, uncomplicated: Secondary | ICD-10-CM | POA: Insufficient documentation

## 2017-03-28 LAB — RAPID STREP SCREEN (MED CTR MEBANE ONLY): STREPTOCOCCUS, GROUP A SCREEN (DIRECT): NEGATIVE

## 2017-03-28 MED ORDER — LIDOCAINE VISCOUS 2 % MT SOLN: 20.0000 mL | OROMUCOSAL | 0 refills | Status: DC | PRN

## 2017-03-28 MED ORDER — KETOROLAC TROMETHAMINE 30 MG/ML IJ SOLN
30.0000 mg | Freq: Once | INTRAMUSCULAR | Status: AC
  Administered 2017-03-28: 30 mg via INTRAMUSCULAR
  Filled 2017-03-28: qty 1

## 2017-03-28 MED ORDER — DEXAMETHASONE 10 MG/ML FOR PEDIATRIC ORAL USE: 10.0000 mg | Freq: Once | INTRAMUSCULAR | Status: DC

## 2017-03-28 MED ORDER — DEXAMETHASONE 4 MG PO TABS
10.0000 mg | ORAL_TABLET | Freq: Once | ORAL | Status: AC
  Administered 2017-03-28: 10 mg via ORAL
  Filled 2017-03-28: qty 3

## 2017-03-28 MED ORDER — PENICILLIN G BENZATHINE 1200000 UNIT/2ML IM SUSP
1.2000 10*6.[IU] | Freq: Once | INTRAMUSCULAR | Status: AC
  Administered 2017-03-28: 1.2 10*6.[IU] via INTRAMUSCULAR
  Filled 2017-03-28: qty 2

## 2017-03-28 NOTE — ED Triage Notes (Signed)
Sore throat since last pm, pain severe with swallowing

## 2017-03-28 NOTE — Discharge Instructions (Signed)
Has been treated for strep throat in the ED. Continue taking Motrin or ibuprofen at home every 4-6 hours. May use the lidocaine mouthwash to help numb the throat. If he develops any difficulty swallowing, worsening swelling, difficulties opening her mouth return to the ED.

## 2017-03-28 NOTE — ED Provider Notes (Signed)
[facility] Provider Note   CSN: [id number] Arrival date & time: 03/28/17  1009     History   Chief Complaint Chief Complaint  Patient presents with  . Sore Throat    HPI Brett Henry is a 26 y.o. male.  HPI 26 year old African-American male with no significant past medical history presents to the ED with complaints of sore throat. Patient states the pain started this a.m. Hurts to swallow. The patient is concerning may have strep throat. Denies any fevers. Able tolerate by mouth fluids. He has not tried nothing for his pain prior to arrival. Nothing makes better or worse. Denies any associated symptoms of fever, chills, URI symptoms, cough, neck stiffness, headache. Past Medical History:  Diagnosis Date  . Asthma     There are no active problems to display for this patient.   Past Surgical History:  Procedure Laterality Date  . EYE SURGERY         Home Medications    Prior to Admission medications   Medication Sig Start Date End Date Taking? Authorizing Provider  ibuprofen (ADVIL,MOTRIN) 600 MG tablet Take 1 tablet (600 mg total) by mouth every 6 (six) hours as needed. 10/30/16   Barrett Henle, PA-C  lidocaine (XYLOCAINE) 2 % solution Use as directed 20 mLs in the mouth or throat as needed for mouth pain. 03/28/17   Rise Mu, PA-C  naproxen (NAPROSYN) 250 MG tablet Take 1 tablet (250 mg total) by mouth 2 (two) times daily with a meal. 03/02/16   Everlene Farrier, PA-C  naproxen (NAPROSYN) 500 MG tablet Take 1 tablet (500 mg total) by mouth 2 (two) times daily. 04/09/16   Sam, Ace Gins, PA-C  ondansetron (ZOFRAN) 4 MG tablet Take 1 tablet (4 mg total) by mouth every 6 (six) hours. 03/31/16   Hayden Rasmussen, NP  promethazine (PHENERGAN) 25 MG tablet Take 1 tablet (25 mg total) by mouth every 6 (six) hours as needed for nausea or vomiting. 04/21/16   Tharon Aquas, PA    Family History No family history on file.  Social History Social  History  Substance Use Topics  . Smoking status: Current Every Day Smoker    Packs/day: 1.00    Types: Cigarettes  . Smokeless tobacco: Current User  . Alcohol use Yes     Comment: occasional     Allergies   Tylenol [acetaminophen]   Review of Systems Review of Systems  Constitutional: Negative for chills and fever.  HENT: Positive for sore throat. Negative for congestion and trouble swallowing.   Gastrointestinal: Negative for nausea and vomiting.  Musculoskeletal: Negative for neck pain and neck stiffness.     Physical Exam Updated Vital Signs BP 133/72 (BP Location: Left Arm)   Pulse 71   Temp 98.2 F (36.8 C) (Oral)   Resp 18   Ht 5\' 6"  (1.676 m)   Wt 68 kg (150 lb)   SpO2 100%   BMI 24.21 kg/m   Physical Exam  Constitutional: He appears well-developed and well-nourished. No distress.  HENT:  Head: Normocephalic and atraumatic.  Right Ear: Tympanic membrane, external ear and ear canal normal.  Left Ear: Tympanic membrane, external ear and ear canal normal.  Nose: No mucosal edema or rhinorrhea.  Mouth/Throat: Uvula is midline and mucous membranes are normal. No trismus in the jaw. No uvula swelling. Posterior oropharyngeal edema and posterior oropharyngeal erythema present. No oropharyngeal exudate or tonsillar abscesses. Tonsils are 2+ on the right. Tonsils are 2+ on the  left. No tonsillar exudate.  Eyes: Right eye exhibits no discharge. Left eye exhibits no discharge. No scleral icterus.  Neck: Normal range of motion. Neck supple.  Cardiovascular: Normal rate and regular rhythm.   Pulmonary/Chest: No respiratory distress.  Musculoskeletal: Normal range of motion.  Lymphadenopathy:    He has cervical adenopathy (bilatearl).  Neurological: He is alert.  Skin: No pallor.  Psychiatric: His behavior is normal. Judgment and thought content normal.  Nursing note and vitals reviewed.    ED Treatments / Results  Labs (all labs ordered are listed, but only  abnormal results are displayed) Labs Reviewed  RAPID STREP SCREEN (NOT AT [facility])  CULTURE, GROUP A STREP [facility])    EKG  EKG Interpretation None       Radiology No results found.  Procedures Procedures (including critical care time)  Medications Ordered in ED Medications  penicillin g benzathine (BICILLIN LA) 1200000 UNIT/2ML injection 1.2 Million Units (not administered)  ketorolac (TORADOL) 30 MG/ML injection 30 mg (not administered)  dexamethasone (DECADRON) tablet 10 mg (not administered)     Initial Impression / Assessment and Plan / ED Course  I have reviewed the triage vital signs and the nursing notes.  Pertinent labs & imaging results that were available during my care of the patient were reviewed by me and considered in my medical decision making (see chart for details).     Pt febrile with cervical lymphadenopathy, & dysphagia. Tonsils are edematous and erythematous without any exudate. Strep test was negative. However we'll treat given patient's presentation. Treated in the Ed with steroids, NSAIDs, Pain medication and PCN IM.  Pt appears mildly dehydrated, discussed importance of water rehydration. Presentation non concerning for PTA or infxn spread to soft tissue. No trismus or uvula deviation. Specific return precautions discussed. Pt able to drink water in ED without difficulty with intact air way. Recommended PCP follow up.    Final Clinical Impressions(s) / ED Diagnoses   Final diagnoses:  Sore throat    New Prescriptions New Prescriptions   LIDOCAINE (XYLOCAINE) 2 % SOLUTION    Use as directed 20 mLs in the mouth or throat as needed for mouth pain.     Rise Mu, PA-C 03/28/17 1206    Rolland Porter, MD 04/09/17 [phone]

## 2017-03-28 NOTE — ED Notes (Signed)
Declined W/C at D/C and was escorted to lobby by RN. 

## 2017-03-30 LAB — CULTURE, GROUP A STREP (THRC)

## 2018-01-15 ENCOUNTER — Other Ambulatory Visit: Payer: Self-pay

## 2018-01-15 ENCOUNTER — Emergency Department (HOSPITAL_COMMUNITY)
Admission: EM | Admit: 2018-01-15 | Discharge: 2018-01-15 | Disposition: A | Discharge status: Home / Self Care | Payer: No Typology Code available for payment source | Attending: Emergency Medicine | Admitting: Emergency Medicine

## 2018-01-15 ENCOUNTER — Emergency Department (HOSPITAL_COMMUNITY): Payer: No Typology Code available for payment source

## 2018-01-15 ENCOUNTER — Encounter (HOSPITAL_COMMUNITY): Payer: Self-pay | Admitting: Emergency Medicine

## 2018-01-15 DIAGNOSIS — M79622 Pain in left upper arm: Secondary | ICD-10-CM

## 2018-01-15 DIAGNOSIS — Y999 Unspecified external cause status: Secondary | ICD-10-CM | POA: Diagnosis not present

## 2018-01-15 DIAGNOSIS — M542 Cervicalgia: Secondary | ICD-10-CM

## 2018-01-15 DIAGNOSIS — F17210 Nicotine dependence, cigarettes, uncomplicated: Secondary | ICD-10-CM | POA: Insufficient documentation

## 2018-01-15 DIAGNOSIS — J45909 Unspecified asthma, uncomplicated: Secondary | ICD-10-CM | POA: Diagnosis not present

## 2018-01-15 DIAGNOSIS — Y9389 Activity, other specified: Secondary | ICD-10-CM | POA: Insufficient documentation

## 2018-01-15 DIAGNOSIS — Z23 Encounter for immunization: Secondary | ICD-10-CM | POA: Insufficient documentation

## 2018-01-15 DIAGNOSIS — Y92410 Unspecified street and highway as the place of occurrence of the external cause: Secondary | ICD-10-CM | POA: Diagnosis not present

## 2018-01-15 DIAGNOSIS — Z79899 Other long term (current) drug therapy: Secondary | ICD-10-CM | POA: Diagnosis not present

## 2018-01-15 DIAGNOSIS — M545 Low back pain, unspecified: Secondary | ICD-10-CM
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
## 2018-01-15 MED ORDER — IBUPROFEN 800 MG PO TABS
800.0000 mg | ORAL_TABLET | Freq: Once | ORAL | Status: AC
  Administered 2018-01-15: 800 mg via ORAL
  Filled 2018-01-15: qty 1

## 2018-01-15 MED ORDER — IBUPROFEN 600 MG PO TABS: 600.0000 mg | ORAL_TABLET | Freq: Four times a day (QID) | ORAL | 0 refills | Status: DC | PRN

## 2018-01-15 MED ORDER — TETANUS-DIPHTH-ACELL PERTUSSIS 5-2.5-18.5 LF-MCG/0.5 IM SUSP
0.5000 mL | Freq: Once | INTRAMUSCULAR | Status: AC
  Administered 2018-01-15: 0.5 mL via INTRAMUSCULAR
  Filled 2018-01-15: qty 0.5

## 2018-01-15 MED ORDER — CYCLOBENZAPRINE HCL 10 MG PO TABS: 10.0000 mg | ORAL_TABLET | Freq: Two times a day (BID) | ORAL | 0 refills | Status: DC | PRN

## 2018-01-15 MED ORDER — CYCLOBENZAPRINE HCL 10 MG PO TABS
10.0000 mg | ORAL_TABLET | Freq: Once | ORAL | Status: AC
  Administered 2018-01-15: 10 mg via ORAL
  Filled 2018-01-15: qty 1
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
## 2018-01-15 NOTE — Discharge Instructions (Signed)

## 2018-01-15 NOTE — ED Notes (Signed)
Pt and family understood dc material. NAD noted. Scripts and doctors excuse given at [facility]
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
## 2018-01-15 NOTE — ED Notes (Signed)
Greta DoomBowie, PA in to see patient and x-rays ordered.
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
## 2018-04-20 DIAGNOSIS — Z6820 Body mass index (BMI) 20.0-20.9, adult: Secondary | ICD-10-CM | POA: Diagnosis not present

## 2018-04-20 DIAGNOSIS — Z0000 Encounter for general adult medical examination without abnormal findings: Secondary | ICD-10-CM | POA: Diagnosis not present

## 2018-04-20 DIAGNOSIS — J45909 Unspecified asthma, uncomplicated: Secondary | ICD-10-CM | POA: Diagnosis not present

## 2019-01-04 ENCOUNTER — Ambulatory Visit (INDEPENDENT_AMBULATORY_CARE_PROVIDER_SITE_OTHER): Payer: BC Managed Care – PPO | Admitting: Adult Health

## 2019-01-04 ENCOUNTER — Encounter: Payer: Self-pay | Admitting: Adult Health

## 2019-01-04 ENCOUNTER — Other Ambulatory Visit: Payer: Self-pay

## 2019-01-04 DIAGNOSIS — J45990 Exercise induced bronchospasm: Secondary | ICD-10-CM | POA: Diagnosis not present

## 2019-01-04 DIAGNOSIS — Z7689 Persons encountering health services in other specified circumstances: Secondary | ICD-10-CM

## 2019-01-04 DIAGNOSIS — Z720 Tobacco use: Secondary | ICD-10-CM

## 2019-01-04 MED ORDER — ALBUTEROL SULFATE HFA 108 (90 BASE) MCG/ACT IN AERS: 2.0000 | INHALATION_SPRAY | Freq: Four times a day (QID) | RESPIRATORY_TRACT | 2 refills | Status: DC | PRN

## 2019-01-04 MED ORDER — VARENICLINE TARTRATE 0.5 MG X 11 & 1 MG X 42 PO MISC: ORAL | 0 refills | Status: DC

## 2019-01-04 NOTE — Progress Notes (Signed)
Virtual Visit via Video Note  I connected withAntonio Henry on 01/04/19 at  1:00 PM EDT by a video enabled telemedicine application and verified that I am speaking with the correct person using two identifiers.  Location patient: home Location provider:work or home office Persons participating in the virtual visit: patient, provider  I discussed the limitations of evaluation and management by telemedicine and the availability of in person appointments. The patient expressed understanding and agreed to proceed.   Patient presents to clinic today to establish care.  Acute Concerns: Establish Care  Chronic Issues: Asthma-exercise-induced.  Does not have a rescue inhaler at this time.  No recent acute flares  Tobacco Use -smokes about a pack a day and has been doing so for approximately 10 years.  His wife is pregnant and he would like to quit smoking.  Health Maintenance: Dental -- Routine  Vision -- Does not do routine care Immunizations -- UTD  Past Medical History:  Diagnosis Date  . Asthma     Past Surgical History:  Procedure Laterality Date  . EYE SURGERY Left    age 23     No current outpatient medications on file prior to visit.   No current facility-administered medications on file prior to visit.     Allergies  Allergen Reactions  . Tylenol [Acetaminophen] Anaphylaxis    Family History  Problem Relation Age of Onset  . Asthma Mother   . Gout Father   . Diabetes Maternal Grandmother     Social History   Socioeconomic History  . Marital status: Single    Spouse name: Not on file  . Number of children: Not on file  . Years of education: Not on file  . Highest education level: Not on file  Occupational History  . Not on file  Social Needs  . Financial resource strain: Not on file  . Food insecurity:    Worry: Not on file    Inability: Not on file  . Transportation needs:    Medical: Not on file    Non-medical: Not on file  Tobacco Use   . Smoking status: Current Every Day Smoker    Packs/day: 1.00    Years: 10.00    Pack years: 10.00    Types: Cigarettes  . Smokeless tobacco: Never Used  Substance and Sexual Activity  . Alcohol use: Yes    Comment: occasional  . Drug use: No  . Sexual activity: Not on file  Lifestyle  . Physical activity:    Days per week: Not on file    Minutes per session: Not on file  . Stress: Not on file  Relationships  . Social connections:    Talks on phone: Not on file    Gets together: Not on file    Attends religious service: Not on file    Active member of club or organization: Not on file    Attends meetings of clubs or organizations: Not on file    Relationship status: Not on file  . Intimate partner violence:    Fear of current or ex partner: Not on file    Emotionally abused: Not on file    Physically abused: Not on file    Forced sexual activity: Not on file  Other Topics Concern  . Not on file  Social History Narrative   Not currently working     Review of Systems  Constitutional: Negative.   HENT: Negative.   Eyes: Negative.   Respiratory: Negative.  Cardiovascular: Negative.   Gastrointestinal: Negative.   Genitourinary: Negative.   Musculoskeletal: Negative.   Skin: Negative.   Neurological: Negative.   Endo/Heme/Allergies: Negative.   Psychiatric/Behavioral: Negative.   All other systems reviewed and are negative.   There were no vitals taken for this visit.  Physical Exam VITALS per patient if applicable:  GENERAL: alert, oriented, appears well and in no acute distress  HEENT: atraumatic, conjunttiva clear, no obvious abnormalities on inspection of external nose and ears  NECK: normal movements of the head and neck  LUNGS: on inspection no signs of respiratory distress, breathing rate appears normal, no obvious gross SOB, gasping or wheezing  CV: no obvious cyanosis  MS: moves all visible extremities without noticeable abnormality   PSYCH/NEURO: pleasant and cooperative, no obvious depression or anxiety, speech and thought processing grossly intact  No results found for this or any previous visit (from the past 2160 hour(s)).  Assessment/Plan:  Discussed the following assessment and plan:  Exercise-induced asthma - Plan: albuterol (VENTOLIN HFA) 108 (90 Base) MCG/ACT inhaler  Encounter to establish care  Tobacco use - Plan: varenicline (CHANTIX STARTING MONTH PAK) 0.5 MG X 11 & 1 MG X 42 tablet  We discussed quitting smoking and various methods to do so.  Ultimately he decided that he wanted to try Chantix.  We will have him start on the Chantix starting pack and follow-up for CPE prior to finishing.  We reviewed side effects of this medication and if he experiences any he is to follow-up with me immediately   I discussed the assessment and treatment plan with the patient. The patient was provided an opportunity to ask questions and all were answered. The patient agreed with the plan and demonstrated an understanding of the instructions.   The patient was advised to call back or seek an in-person evaluation if the symptoms worsen or if the condition fails to improve as anticipated.   Shirline Freesory Maxine Huynh, NP

## 2019-01-30 ENCOUNTER — Other Ambulatory Visit: Payer: Self-pay | Admitting: Adult Health

## 2019-01-30 DIAGNOSIS — Z720 Tobacco use: Secondary | ICD-10-CM

## 2019-04-27 ENCOUNTER — Other Ambulatory Visit: Payer: Self-pay

## 2019-04-27 ENCOUNTER — Encounter: Payer: Self-pay | Admitting: Adult Health

## 2019-04-27 ENCOUNTER — Ambulatory Visit (INDEPENDENT_AMBULATORY_CARE_PROVIDER_SITE_OTHER): Payer: BC Managed Care – PPO | Admitting: Adult Health

## 2019-04-27 VITALS — BP 110/58 | HR 72 | Temp 98.3°F | Wt 169.2 lb

## 2019-04-27 DIAGNOSIS — K2100 Gastro-esophageal reflux disease with esophagitis, without bleeding: Secondary | ICD-10-CM

## 2019-04-27 DIAGNOSIS — J45990 Exercise induced bronchospasm: Secondary | ICD-10-CM

## 2019-04-27 DIAGNOSIS — Z720 Tobacco use: Secondary | ICD-10-CM | POA: Diagnosis not present

## 2019-04-27 DIAGNOSIS — Z23 Encounter for immunization: Secondary | ICD-10-CM

## 2019-04-27 DIAGNOSIS — Z0000 Encounter for general adult medical examination without abnormal findings: Secondary | ICD-10-CM

## 2019-04-27 LAB — COMPREHENSIVE METABOLIC PANEL
ALT: 20 U/L (ref 0–53)
AST: 15 U/L (ref 0–37)
Albumin: 4.5 g/dL (ref 3.5–5.2)
Alkaline Phosphatase: 65 U/L (ref 39–117)
BUN: 13 mg/dL (ref 6–23)
CO2: 28 mEq/L (ref 19–32)
Calcium: 9.8 mg/dL (ref 8.4–10.5)
Chloride: 106 mEq/L (ref 96–112)
Creatinine, Ser: 1.07 mg/dL (ref 0.40–1.50)
GFR: 98.53 mL/min (ref >60.00)
Glucose, Bld: 73 mg/dL (ref 70–99)
Potassium: 4.2 mEq/L (ref 3.5–5.1)
Sodium: 139 mEq/L (ref 135–145)
Total Bilirubin: 0.3 mg/dL (ref 0.2–1.2)
Total Protein: 7.3 g/dL (ref 6.0–8.3)

## 2019-04-27 LAB — CBC WITH DIFFERENTIAL/PLATELET
Basophils Absolute: 0.1 10*3/uL (ref 0.0–0.1)
Basophils Relative: 0.8 % (ref 0.0–3.0)
Eosinophils Absolute: 0.5 10*3/uL (ref 0.0–0.7)
Eosinophils Relative: 6.5 % — ABNORMAL HIGH (ref 0.0–5.0)
HCT: 43.9 % (ref 39.0–52.0)
Hemoglobin: 15 g/dL (ref 13.0–17.0)
Lymphocytes Relative: 30.6 % (ref 12.0–46.0)
Lymphs Abs: 2.5 10*3/uL (ref 0.7–4.0)
MCHC: 34.1 g/dL (ref 30.0–36.0)
MCV: 89.5 fl (ref 78.0–100.0)
Monocytes Absolute: 0.7 10*3/uL (ref 0.1–1.0)
Monocytes Relative: 8 % (ref 3.0–12.0)
Neutro Abs: 4.5 10*3/uL (ref 1.4–7.7)
Neutrophils Relative %: 54.1 % (ref 43.0–77.0)
Platelets: 188 10*3/uL (ref 150.0–400.0)
RBC: 4.9 Mil/uL (ref 4.22–5.81)
RDW: 13 % (ref 11.5–15.5)
WBC: 8.3 10*3/uL (ref 4.0–10.5)

## 2019-04-27 LAB — LIPID PANEL
Cholesterol: 190 mg/dL (ref 0–200)
HDL: 40.1 mg/dL (ref >39.00)
LDL Cholesterol: 116 mg/dL — ABNORMAL HIGH (ref 0–99)
NonHDL: 149.78
Total CHOL/HDL Ratio: 5
Triglycerides: 170 mg/dL — ABNORMAL HIGH (ref 0.0–149.0)
VLDL: 34 mg/dL (ref 0.0–40.0)

## 2019-04-27 LAB — TSH: TSH: 0.63 u[IU]/mL (ref 0.35–4.50)

## 2019-04-27 MED ORDER — ALBUTEROL SULFATE HFA 108 (90 BASE) MCG/ACT IN AERS: 2.0000 | INHALATION_SPRAY | Freq: Four times a day (QID) | RESPIRATORY_TRACT | 3 refills | Status: DC | PRN

## 2019-04-27 MED ORDER — OMEPRAZOLE 20 MG PO CPDR: 20.0000 mg | DELAYED_RELEASE_CAPSULE | Freq: Every day | ORAL | 3 refills | Status: DC

## 2019-04-27 NOTE — Progress Notes (Signed)
Subjective:    Patient ID: Brett Henry, male    DOB: 1991-03-18, 28 y.o.   MRN: [id number]  HPI Patient presents for yearly preventative medicine examination. He is a pleasant 28 year old male who  has a past medical history of Asthma.   He reports that he is doing well.  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  He denies any recent flares but will have to use his inhaler every once in awhile while exercising.   Tobacco Use-prescribed Chantix back in May 2020.  At this time he was smoking about a pack a day and had been doing so for approximately 10 years. He was able to quit smoking and has been smoke free since May.   GERD -reports intermittent epigastric discomfort described as a burning sensation in his stomach and radiates into the back of his throat.  Sure of how long this is been happening.  Denies waking up with a sour taste in his mouth.  Has been trying to change his diet and eat healthier.  All immunizations and health maintenance protocols were reviewed with the patient and needed orders were placed.  Appropriate screening laboratory values were ordered for the patient including screening of hyperlipidemia, renal function and hepatic function.  Medication reconciliation,  past medical history, social history, problem list and allergies were reviewed in detail with the patient  Goals were established with regard to weight loss, exercise, and  diet in compliance with medications   Review of Systems  Constitutional: Negative.   HENT: Negative.   Eyes: Negative.   Respiratory: Negative.   Cardiovascular: Negative.   Gastrointestinal: Positive for abdominal pain. Negative for constipation, diarrhea, nausea and vomiting.  Endocrine: Negative.   Genitourinary: Negative.   Musculoskeletal: Negative.   Skin: Negative.   Allergic/Immunologic: Negative.   Neurological: Negative.    Hematological: Negative.   Psychiatric/Behavioral: Negative.   All other systems reviewed and are negative.  Past Medical History:  Diagnosis Date  . Asthma     Social History   Socioeconomic History  . Marital status: Single    Spouse name: Not on file  . Number of children: Not on file  . Years of education: Not on file  . Highest education level: Not on file  Occupational History  . Not on file  Social Needs  . Financial resource strain: Not on file  . Food insecurity    Worry: Not on file    Inability: Not on file  . Transportation needs    Medical: Not on file    Non-medical: Not on file  Tobacco Use  . Smoking status: Current Every Day Smoker    Packs/day: 1.00    Years: 10.00    Pack years: 10.00    Types: Cigarettes  . Smokeless tobacco: Never Used  Substance and Sexual Activity  . Alcohol use: Yes    Comment: occasional  . Drug use: No  . Sexual activity: Not on file  Lifestyle  . Physical activity    Days per week: Not on file    Minutes per session: Not on file  . Stress: Not on file  Relationships  . Social Musicianconnections    Talks on phone: Not on file    Gets together: Not on file    Attends religious service: Not on file    Active member of club or organization: Not on file    Attends meetings of clubs or  organizations: Not on file    Relationship status: Not on file  . Intimate partner violence    Fear of current or ex partner: Not on file    Emotionally abused: Not on file    Physically abused: Not on file    Forced sexual activity: Not on file  Other Topics Concern  . Not on file  Social History Narrative   Not currently working     Past Surgical History:  Procedure Laterality Date  . EYE SURGERY Left    age 46     Family History  Problem Relation Age of Onset  . Asthma Mother   . Gout Father   . Diabetes Maternal Grandmother     Allergies  Allergen Reactions  . Tylenol [Acetaminophen] Anaphylaxis    Current Outpatient  Medications on File Prior to Visit  Medication Sig Dispense Refill  . albuterol (VENTOLIN HFA) 108 (90 Base) MCG/ACT inhaler Inhale 2 puffs into the lungs every 6 (six) hours as needed for wheezing or shortness of breath. 1 Inhaler 2   No current facility-administered medications on file prior to visit.     BP (!) 110/58 (BP Location: Left Arm, Patient Position: Sitting, Cuff Size: Normal)   Pulse 72   Temp 98.3 F (36.8 C) (Temporal)   Wt 169 lb 3.2 oz (76.7 kg)   SpO2 97%   BMI 27.31 kg/m       Objective:   Physical Exam Vitals signs and nursing note reviewed.  Constitutional:      General: He is not in acute distress.    Appearance: Normal appearance. He is not diaphoretic.  HENT:     Head: Normocephalic and atraumatic.     Right Ear: Tympanic membrane, ear canal and external ear normal. There is no impacted cerumen.     Left Ear: Tympanic membrane, ear canal and external ear normal. There is no impacted cerumen.     Nose: Nose normal. No congestion.     Mouth/Throat:     Mouth: Mucous membranes are moist.     Pharynx: Oropharynx is clear. No oropharyngeal exudate or posterior oropharyngeal erythema.  Eyes:     General: No scleral icterus.       Right eye: No discharge.        Left eye: No discharge.     Conjunctiva/sclera: Conjunctivae normal.     Pupils: Pupils are equal, round, and reactive to light.  Neck:     Musculoskeletal: Normal range of motion and neck supple.     Thyroid: No thyromegaly.     Vascular: No JVD.     Trachea: No tracheal deviation.  Cardiovascular:     Rate and Rhythm: Normal rate and regular rhythm.     Pulses: Normal pulses.     Heart sounds: Normal heart sounds. No murmur. No friction rub. No gallop.   Pulmonary:     Effort: Pulmonary effort is normal. No respiratory distress.     Breath sounds: Normal breath sounds. No stridor. No wheezing, rhonchi or rales.  Chest:     Chest wall: No tenderness.  Abdominal:     General: Abdomen is  flat. Bowel sounds are normal. There is no distension.     Palpations: Abdomen is soft. There is no mass.     Tenderness: There is abdominal tenderness in the epigastric area. There is no guarding or rebound.  Musculoskeletal: Normal range of motion.        General: No swelling, tenderness, deformity or  signs of injury.     Right lower leg: No edema.     Left lower leg: No edema.  Lymphadenopathy:     Cervical: No cervical adenopathy.  Skin:    General: Skin is warm and dry.     Coloration: Skin is not jaundiced or pale.     Findings: No bruising, erythema, lesion or rash.  Neurological:     Mental Status: He is alert and oriented to person, place, and time.     Cranial Nerves: No cranial nerve deficit.     Motor: No weakness or abnormal muscle tone.     Coordination: Coordination normal.     Gait: Gait normal.     Deep Tendon Reflexes: Reflexes are normal and symmetric. Reflexes normal.  Psychiatric:        Mood and Affect: Mood normal.        Behavior: Behavior normal.        Thought Content: Thought content normal.        Judgment: Judgment normal.       Assessment & Plan:  1. Routine general medical examination at a health care facility -Continue to stay active and eat healthy. -Follow up in 1 year or sooner if needed - CBC with Differential/Platelet - Comprehensive metabolic panel - Lipid panel - TSH  2. Tobacco use -Congratulated on quitting smoking.  3. Exercise-induced asthma  - albuterol (VENTOLIN HFA) 108 (90 Base) MCG/ACT inhaler; Inhale 2 puffs into the lungs every 6 (six) hours as needed for wheezing or shortness of breath.  Dispense: 18 g; Refill: 3  4. Gastroesophageal reflux disease with esophagitis - Take for 30 days and then stop. If needed can restart for an additional 30 days - Continue to work on heart healthy diet - Stay away from fast foods, fried foods, and spicy foods - omeprazole (PRILOSEC) 20 MG capsule; Take 1 capsule (20 mg total) by mouth  daily.  Dispense: 30 capsule; Refill: 3  5. Need for influenza vaccination  - Flu Vaccine QUAD 36+ mos IM  Shirline Frees, NP

## 2019-06-06 ENCOUNTER — Encounter: Payer: Self-pay | Admitting: Adult Health

## 2019-06-07 ENCOUNTER — Other Ambulatory Visit: Payer: Self-pay

## 2019-06-07 ENCOUNTER — Telehealth (INDEPENDENT_AMBULATORY_CARE_PROVIDER_SITE_OTHER): Payer: BC Managed Care – PPO | Admitting: Adult Health

## 2019-06-07 DIAGNOSIS — T148XXA Other injury of unspecified body region, initial encounter: Secondary | ICD-10-CM

## 2019-06-07 MED ORDER — CYCLOBENZAPRINE HCL 10 MG PO TABS: 10.0000 mg | ORAL_TABLET | Freq: Every day | ORAL | 0 refills | Status: AC

## 2019-06-07 MED ORDER — METHYLPREDNISOLONE 4 MG PO TBPK: ORAL_TABLET | ORAL | 0 refills | Status: DC

## 2019-06-07 NOTE — Progress Notes (Signed)
Virtual Visit via Video Note  I connected with Brett Henry on 06/07/19 at 10:00 AM EDT by a video enabled telemedicine application and verified that I am speaking with the correct person using two identifiers.  Location patient: home Location provider:work or home office Persons participating in the virtual visit: patient, provider  I discussed the limitations of evaluation and management by telemedicine and the availability of in person appointments. The patient expressed understanding and agreed to proceed.   HPI: 28 year old male who is being evaluated today for an acute issue.  His symptoms started 1 week ago.  He reports neck and upper back pain.  Pain is described as a "tight sensation".  His discomfort is worse with head movements vertically and laterally as well as change in positions.  He denies headaches, blurred vision, numbness or tingling in his arms.  He has not noticed any improvement with hot compresses, showers, or Motrin.  He denies aggravating injury or trauma.   ROS: See pertinent positives and negatives per HPI.  Past Medical History:  Diagnosis Date  . Asthma     Past Surgical History:  Procedure Laterality Date  . EYE SURGERY Left    age 67     Family History  Problem Relation Age of Onset  . Asthma Mother   . Gout Father   . Diabetes Maternal Grandmother      Current Outpatient Medications:  .  albuterol (VENTOLIN HFA) 108 (90 Base) MCG/ACT inhaler, Inhale 2 puffs into the lungs every 6 (six) hours as needed for wheezing or shortness of breath., Disp: 18 g, Rfl: 3 .  omeprazole (PRILOSEC) 20 MG capsule, Take 1 capsule (20 mg total) by mouth daily., Disp: 30 capsule, Rfl: 3  EXAM:  VITALS per patient if applicable:  GENERAL: alert, oriented, appears well and in no acute distress  HEENT: atraumatic, conjunttiva clear, no obvious abnormalities on inspection of external nose and ears  NECK: normal movements of the head and neck  LUNGS: on  inspection no signs of respiratory distress, breathing rate appears normal, no obvious gross SOB, gasping or wheezing  CV: no obvious cyanosis  MS: moves all visible extremities without noticeable abnormality  PSYCH/NEURO: pleasant and cooperative, no obvious depression or anxiety, speech and thought processing grossly intact  ASSESSMENT AND PLAN:  Discussed the following assessment and plan:  1. Muscle strain -Likely muscle strain.  Not concerned about cervical radiculopathy bulging disc.  Will prescribe Flexeril and Medrol Dosepak.  He was advised to continue with warm compress and gentle stretching exercises.  Follow-up if symptoms have not resolved by the completion of Medrol Dosepak - cyclobenzaprine (FLEXERIL) 10 MG tablet; Take 1 tablet (10 mg total) by mouth at bedtime for 10 days.  Dispense: 10 tablet; Refill: 0 - methylPREDNISolone (MEDROL DOSEPAK) 4 MG TBPK tablet; Take as directed  Dispense: 21 tablet; Refill: 0     I discussed the assessment and treatment plan with the patient. The patient was provided an opportunity to ask questions and all were answered. The patient agreed with the plan and demonstrated an understanding of the instructions.   The patient was advised to call back or seek an in-person evaluation if the symptoms worsen or if the condition fails to improve as anticipated.   Dorothyann Peng, NP

## 2019-06-23 ENCOUNTER — Ambulatory Visit: Payer: BC Managed Care – PPO | Admitting: Adult Health

## 2019-06-23 ENCOUNTER — Other Ambulatory Visit: Payer: Self-pay | Admitting: Adult Health

## 2019-06-23 DIAGNOSIS — K2100 Gastro-esophageal reflux disease with esophagitis, without bleeding: Secondary | ICD-10-CM

## 2019-06-27 NOTE — Telephone Encounter (Signed)
4. Gastroesophageal reflux disease with esophagitis - Take for 30 days and then stop. If needed can restart for an additional 30 days - Continue to work on heart healthy diet - Stay away from fast foods, fried foods, and spicy foods - omeprazole (PRILOSEC) 20 MG capsule; Take 1 capsule (20 mg total) by mouth daily.  Dispense: 30 capsule; Refill: 3  Should pt continue this medication?

## 2019-08-02 ENCOUNTER — Other Ambulatory Visit: Payer: Self-pay

## 2019-08-03 ENCOUNTER — Ambulatory Visit (INDEPENDENT_AMBULATORY_CARE_PROVIDER_SITE_OTHER): Payer: BC Managed Care – PPO | Admitting: Adult Health

## 2019-08-03 ENCOUNTER — Encounter: Payer: Self-pay | Admitting: Family Medicine

## 2019-08-03 ENCOUNTER — Encounter: Payer: Self-pay | Admitting: Adult Health

## 2019-08-03 VITALS — BP 116/74 | Temp 98.3°F | Wt 160.0 lb

## 2019-08-03 DIAGNOSIS — G8929 Other chronic pain: Secondary | ICD-10-CM

## 2019-08-03 DIAGNOSIS — M545 Low back pain: Secondary | ICD-10-CM | POA: Diagnosis not present

## 2019-08-03 MED ORDER — TRAMADOL HCL 50 MG PO TABS: 50.0000 mg | ORAL_TABLET | Freq: Three times a day (TID) | ORAL | 0 refills | Status: DC | PRN
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
## 2019-08-03 NOTE — Progress Notes (Signed)
   Subjective:    Patient ID: Brett Henry, male    DOB: Oct 14, 1990, 28 y.o.   MRN: [id number]  HPI    Review of Systems     Objective:   Physical Exam        Assessment & Plan:

## 2019-08-09 ENCOUNTER — Ambulatory Visit: Payer: BC Managed Care – PPO | Admitting: Family Medicine

## 2019-08-14 ENCOUNTER — Ambulatory Visit (INDEPENDENT_AMBULATORY_CARE_PROVIDER_SITE_OTHER): Payer: BC Managed Care – PPO | Admitting: Family Medicine

## 2019-08-14 ENCOUNTER — Encounter: Payer: Self-pay | Admitting: Family Medicine

## 2019-08-14 ENCOUNTER — Other Ambulatory Visit: Payer: Self-pay

## 2019-08-14 DIAGNOSIS — M545 Low back pain: Secondary | ICD-10-CM

## 2019-08-14 DIAGNOSIS — G8929 Other chronic pain: Secondary | ICD-10-CM

## 2019-08-14 MED ORDER — TIZANIDINE HCL 2 MG PO TABS: 2.0000 mg | ORAL_TABLET | Freq: Four times a day (QID) | ORAL | 1 refills | Status: DC | PRN

## 2019-08-14 MED ORDER — MELOXICAM 15 MG PO TABS: 7.5000 mg | ORAL_TABLET | Freq: Every day | ORAL | 6 refills | Status: DC | PRN
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
## 2019-08-21 ENCOUNTER — Other Ambulatory Visit: Payer: Self-pay | Admitting: Family Medicine

## 2019-09-07 ENCOUNTER — Other Ambulatory Visit: Payer: Self-pay | Admitting: Family Medicine

## 2019-09-23 ENCOUNTER — Other Ambulatory Visit: Payer: Self-pay | Admitting: Family Medicine

## 2019-09-27 ENCOUNTER — Telehealth: Payer: Self-pay | Admitting: Family Medicine

## 2019-09-27 NOTE — Telephone Encounter (Signed)
Submitted PA form for approval through CoverMyMeds.

## 2019-09-27 NOTE — Telephone Encounter (Signed)
Pharmacy called.   They said we need to contact the patient's insurance company before they can provide the tiZANidine.  Call back: [phone]

## 2019-09-29 ENCOUNTER — Other Ambulatory Visit: Payer: Self-pay

## 2019-09-29 ENCOUNTER — Emergency Department (HOSPITAL_COMMUNITY): Payer: BC Managed Care – PPO

## 2019-09-29 ENCOUNTER — Emergency Department (HOSPITAL_COMMUNITY)
Admission: EM | Admit: 2019-09-29 | Discharge: 2019-09-30 | Disposition: A | Discharge status: Home / Self Care | Payer: BC Managed Care – PPO | Attending: Emergency Medicine | Admitting: Emergency Medicine

## 2019-09-29 ENCOUNTER — Encounter (HOSPITAL_COMMUNITY): Payer: Self-pay

## 2019-09-29 DIAGNOSIS — Y9389 Activity, other specified: Secondary | ICD-10-CM | POA: Insufficient documentation

## 2019-09-29 DIAGNOSIS — Y990 Civilian activity done for income or pay: Secondary | ICD-10-CM | POA: Diagnosis not present

## 2019-09-29 DIAGNOSIS — M79641 Pain in right hand: Secondary | ICD-10-CM | POA: Diagnosis not present

## 2019-09-29 DIAGNOSIS — Y92410 Unspecified street and highway as the place of occurrence of the external cause: Secondary | ICD-10-CM | POA: Diagnosis not present

## 2019-09-29 DIAGNOSIS — R69 Illness, unspecified: Secondary | ICD-10-CM | POA: Diagnosis not present

## 2019-09-29 DIAGNOSIS — Z79899 Other long term (current) drug therapy: Secondary | ICD-10-CM | POA: Diagnosis not present

## 2019-09-29 DIAGNOSIS — M545 Low back pain, unspecified: Secondary | ICD-10-CM

## 2019-09-29 DIAGNOSIS — M25561 Pain in right knee: Secondary | ICD-10-CM | POA: Diagnosis not present

## 2019-09-29 DIAGNOSIS — M79642 Pain in left hand: Secondary | ICD-10-CM | POA: Diagnosis not present

## 2019-09-29 DIAGNOSIS — M25562 Pain in left knee: Secondary | ICD-10-CM | POA: Insufficient documentation

## 2019-09-29 DIAGNOSIS — S3992XA Unspecified injury of lower back, initial encounter: Secondary | ICD-10-CM | POA: Diagnosis not present

## 2019-09-29 DIAGNOSIS — S6991XA Unspecified injury of right wrist, hand and finger(s), initial encounter: Secondary | ICD-10-CM | POA: Diagnosis not present

## 2019-09-29 DIAGNOSIS — J45909 Unspecified asthma, uncomplicated: Secondary | ICD-10-CM | POA: Insufficient documentation

## 2019-09-29 DIAGNOSIS — F17210 Nicotine dependence, cigarettes, uncomplicated: Secondary | ICD-10-CM | POA: Insufficient documentation

## 2019-09-29 DIAGNOSIS — S8992XA Unspecified injury of left lower leg, initial encounter: Secondary | ICD-10-CM | POA: Diagnosis not present
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
## 2019-09-29 MED ORDER — METHOCARBAMOL 500 MG PO TABS: 500.0000 mg | ORAL_TABLET | Freq: Two times a day (BID) | ORAL | 0 refills | Status: DC
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
## 2019-09-29 NOTE — Discharge Instructions (Addendum)
Please follow-up with your primary care doctor if you continue to have right hand pain you may require in a repeat x-ray.  However today all of your x-rays show that you have no fractures.  As we discussed there was a small deficiency in one of the hand views that prevents [place] from ruling out a dislocation of your finger however there is no evidence of dislocation on my exam.   Please use tylenol and ibuprofen for pain. Please use robaxin as needed at night time for pain overnight.  Please use ice and elevate your hand and knee when you are having pain.   Please use Tylenol or ibuprofen for pain.  You may use 600 mg ibuprofen every 6 hours or 1000 mg of Tylenol every 6 hours.  You may choose to alternate between the 2.  This would be most effective.  Not to exceed 4 g of Tylenol within 24 hours.  Not to exceed 3200 mg ibuprofen 24 hours.

## 2019-10-02 NOTE — Telephone Encounter (Signed)
There is a quantity limit of #120 per 30 days. The patient can have this much filled per month, but no more. A higher quantity was denied by [facility]. I left this information on the pharmacy's voice mail.

## 2019-10-12 ENCOUNTER — Other Ambulatory Visit: Payer: Self-pay

## 2019-10-13 ENCOUNTER — Encounter: Payer: Self-pay | Admitting: Adult Health

## 2019-10-13 ENCOUNTER — Ambulatory Visit (INDEPENDENT_AMBULATORY_CARE_PROVIDER_SITE_OTHER): Payer: BC Managed Care – PPO | Admitting: Adult Health

## 2019-10-13 VITALS — BP 106/72 | Temp 98.5°F | Wt 159.0 lb

## 2019-10-13 DIAGNOSIS — M545 Low back pain, unspecified: Secondary | ICD-10-CM

## 2019-10-13 MED ORDER — PREDNISONE 10 MG PO TABS: ORAL_TABLET | ORAL | 0 refills | Status: DC
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
## 2019-10-13 NOTE — Progress Notes (Signed)
   Subjective:    Patient ID: Brett Henry, male    DOB: Apr 16, 1991, 29 y.o.   MRN: [id number]  HPI 29 year old male who  Past Medical History:  Diagnosis Date  . Asthma       Review of Systems     Objective:   Physical Exam        Assessment & Plan:

## 2019-10-25 DIAGNOSIS — M545 Low back pain: Secondary | ICD-10-CM | POA: Diagnosis not present

## 2019-10-27 ENCOUNTER — Encounter: Payer: Self-pay | Admitting: Adult Health

## 2019-10-27 DIAGNOSIS — H52222 Regular astigmatism, left eye: Secondary | ICD-10-CM | POA: Diagnosis not present

## 2019-10-27 DIAGNOSIS — H5712 Ocular pain, left eye: Secondary | ICD-10-CM | POA: Diagnosis not present

## 2019-10-27 DIAGNOSIS — H25042 Posterior subcapsular polar age-related cataract, left eye: Secondary | ICD-10-CM | POA: Diagnosis not present

## 2019-10-27 DIAGNOSIS — H179 Unspecified corneal scar and opacity: Secondary | ICD-10-CM | POA: Diagnosis not present

## 2019-10-30 DIAGNOSIS — M545 Low back pain: Secondary | ICD-10-CM | POA: Diagnosis not present

## 2019-11-09 ENCOUNTER — Other Ambulatory Visit: Payer: Self-pay | Admitting: Adult Health

## 2019-11-09 DIAGNOSIS — M545 Low back pain, unspecified: Secondary | ICD-10-CM

## 2019-11-09 DIAGNOSIS — G8929 Other chronic pain: Secondary | ICD-10-CM

## 2019-11-09 NOTE — Telephone Encounter (Signed)
Forwarding to PCP.

## 2019-11-25 ENCOUNTER — Ambulatory Visit: Payer: BC Managed Care – PPO | Attending: Internal Medicine

## 2019-11-25 DIAGNOSIS — Z23 Encounter for immunization: Secondary | ICD-10-CM

## 2019-11-25 NOTE — Progress Notes (Signed)
   Covid-19 Vaccination Clinic  Name:  Brett Henry    MRN: [id number] DOB: 06/16/1991  11/25/2019  Mr. Bleier was observed post Covid-19 immunization for 15 minutes without incident. He was provided with Vaccine Information Sheet and instruction to access the V-Safe system.   Mr. Pardo was instructed to call 911 with any severe reactions post vaccine: Marland Kitchen Difficulty breathing  . Swelling of face and throat  . A fast heartbeat  . A bad rash all over body  . Dizziness and weakness   Immunizations Administered    Name Date Dose VIS Date Route   Pfizer COVID-19 Vaccine 11/25/2019 11:03 AM 0.3 mL 07/21/2019 Intramuscular   Manufacturer: [employer], [employer]   Lot: [id number]   NDC: [address]-6

## 2019-12-19 ENCOUNTER — Ambulatory Visit: Payer: BC Managed Care – PPO | Attending: Internal Medicine

## 2019-12-19 DIAGNOSIS — Z23 Encounter for immunization: Secondary | ICD-10-CM

## 2019-12-19 NOTE — Progress Notes (Signed)
   Covid-19 Vaccination Clinic  Name:  Brett Henry    MRN: [id number] DOB: [birth year]  12/19/2019  [name] was observed post Covid-19 immunization for 30 minutes based on pre-vaccination screening without incident. He was provided with Vaccine Information Sheet and instruction to access the V-Safe system.   [name] was instructed to call 911 with any severe reactions post vaccine: Marland Kitchen Difficulty breathing  . Swelling of face and throat  . A fast heartbeat  . A bad rash all over body  . Dizziness and weakness   Immunizations Administered    Name Date Dose VIS Date Route   Pfizer COVID-19 Vaccine 12/19/2019  8:22 AM 0.3 mL 10/04/2018 Intramuscular   Manufacturer: [employer], [employer]   Lot: [id number]   NDC: [address]-3

## 2020-01-02 ENCOUNTER — Ambulatory Visit (INDEPENDENT_AMBULATORY_CARE_PROVIDER_SITE_OTHER)
Admission: RE | Admit: 2020-01-02 | Discharge: 2020-01-02 | Disposition: A | Discharge status: Home / Self Care | Payer: BC Managed Care – PPO | Source: Ambulatory Visit

## 2020-01-02 ENCOUNTER — Other Ambulatory Visit: Payer: Self-pay

## 2020-01-02 DIAGNOSIS — G8929 Other chronic pain: Secondary | ICD-10-CM

## 2020-01-02 DIAGNOSIS — M545 Low back pain, unspecified: Secondary | ICD-10-CM

## 2020-01-02 MED ORDER — CYCLOBENZAPRINE HCL 10 MG PO TABS: 10.0000 mg | ORAL_TABLET | Freq: Two times a day (BID) | ORAL | 0 refills | Status: DC | PRN

## 2020-01-02 MED ORDER — TRAMADOL HCL 50 MG PO TABS: 50.0000 mg | ORAL_TABLET | Freq: Four times a day (QID) | ORAL | 0 refills | Status: DC | PRN

## 2020-01-02 MED ORDER — IBUPROFEN 800 MG PO TABS: 800.0000 mg | ORAL_TABLET | Freq: Three times a day (TID) | ORAL | 0 refills | Status: DC | PRN
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
## 2020-01-02 NOTE — Discharge Instructions (Addendum)
Take the prescribed ibuprofen as needed for your pain.  Take the muscle relaxer Flexeril as needed for muscle spasm; do not drive, operate machinery, or drink alcohol with this medication as it may make you drowsy.    Follow up with an orthopedist if your pain is not improving.  Go to the emergency department if you have worsening pain or develop new symptoms such as difficulty with urination, weakness, numbness, loss of control of your bladder or bowels, fever, chills or other concerns.  

## 2020-01-09 ENCOUNTER — Ambulatory Visit: Payer: BC Managed Care – PPO | Admitting: Adult Health

## 2020-01-09 ENCOUNTER — Ambulatory Visit: Payer: BC Managed Care – PPO | Admitting: Family Medicine

## 2020-01-17 ENCOUNTER — Encounter (HOSPITAL_COMMUNITY): Payer: Self-pay

## 2020-01-17 ENCOUNTER — Ambulatory Visit (HOSPITAL_COMMUNITY)
Admission: EM | Admit: 2020-01-17 | Discharge: 2020-01-17 | Disposition: A | Discharge status: Home / Self Care | Payer: BC Managed Care – PPO | Attending: Urgent Care | Admitting: Urgent Care

## 2020-01-17 ENCOUNTER — Other Ambulatory Visit: Payer: Self-pay

## 2020-01-17 DIAGNOSIS — R109 Unspecified abdominal pain: Secondary | ICD-10-CM

## 2020-01-17 DIAGNOSIS — R197 Diarrhea, unspecified: Secondary | ICD-10-CM | POA: Diagnosis not present

## 2020-01-17 DIAGNOSIS — K529 Noninfective gastroenteritis and colitis, unspecified: Secondary | ICD-10-CM

## 2020-01-17 HISTORY — DX: Dorsalgia, unspecified: M54.9

## 2020-01-17 MED ORDER — ONDANSETRON 8 MG PO TBDP: 8.0000 mg | ORAL_TABLET | Freq: Three times a day (TID) | ORAL | 0 refills | Status: DC | PRN

## 2020-01-17 MED ORDER — LOPERAMIDE HCL 2 MG PO CAPS: 2.0000 mg | ORAL_CAPSULE | Freq: Two times a day (BID) | ORAL | 0 refills | Status: DC | PRN
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
## 2020-01-17 NOTE — Discharge Instructions (Signed)

## 2020-01-17 NOTE — ED Triage Notes (Signed)
Pt c/o acute generalized abdom pain and diarrhea upon waking this morning. Denies any recent illness/symptoms I.e. negative for sore throat, HA, congestion, body aches, fever, chills, or dysuria sx. Denies recent use of OTC medications. Last food intake was last night.

## 2020-02-16 ENCOUNTER — Ambulatory Visit (INDEPENDENT_AMBULATORY_CARE_PROVIDER_SITE_OTHER): Payer: BC Managed Care – PPO | Admitting: Adult Health

## 2020-02-16 ENCOUNTER — Other Ambulatory Visit: Payer: Self-pay

## 2020-02-16 ENCOUNTER — Encounter: Payer: Self-pay | Admitting: Adult Health

## 2020-02-16 VITALS — BP 114/64 | Temp 98.8°F | Wt 158.0 lb

## 2020-02-16 DIAGNOSIS — M545 Low back pain, unspecified: Secondary | ICD-10-CM

## 2020-02-16 DIAGNOSIS — G8929 Other chronic pain: Secondary | ICD-10-CM | POA: Diagnosis not present

## 2020-02-16 MED ORDER — TRAMADOL HCL 50 MG PO TABS: 50.0000 mg | ORAL_TABLET | Freq: Four times a day (QID) | ORAL | 0 refills | Status: DC | PRN
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
## 2020-03-11 ENCOUNTER — Encounter (HOSPITAL_COMMUNITY): Payer: Self-pay | Admitting: Emergency Medicine

## 2020-03-11 ENCOUNTER — Other Ambulatory Visit: Payer: Self-pay

## 2020-03-11 ENCOUNTER — Ambulatory Visit (HOSPITAL_COMMUNITY)
Admission: EM | Admit: 2020-03-11 | Discharge: 2020-03-11 | Disposition: A | Discharge status: Home / Self Care | Payer: BC Managed Care – PPO | Attending: Emergency Medicine | Admitting: Emergency Medicine

## 2020-03-11 DIAGNOSIS — M5441 Lumbago with sciatica, right side: Secondary | ICD-10-CM

## 2020-03-11 MED ORDER — TRAMADOL HCL 50 MG PO TABS: 50.0000 mg | ORAL_TABLET | Freq: Four times a day (QID) | ORAL | 0 refills | Status: DC | PRN

## 2020-03-11 MED ORDER — CYCLOBENZAPRINE HCL 5 MG PO TABS: 5.0000 mg | ORAL_TABLET | Freq: Two times a day (BID) | ORAL | 0 refills | Status: DC | PRN

## 2020-03-11 MED ORDER — KETOROLAC TROMETHAMINE 30 MG/ML IJ SOLN
30.0000 mg | Freq: Once | INTRAMUSCULAR | Status: AC
  Administered 2020-03-11: 30 mg via INTRAMUSCULAR

## 2020-03-11 MED ORDER — PREDNISONE 10 MG PO TABS: ORAL_TABLET | ORAL | 0 refills | Status: DC

## 2020-03-11 MED ORDER — KETOROLAC TROMETHAMINE 30 MG/ML IJ SOLN
INTRAMUSCULAR | Status: AC
  Filled 2020-03-11: qty 1

## 2020-03-11 NOTE — Discharge Instructions (Signed)
We gave you an injection of Toradol Begin prednisone taper over the next 6 days-begin with 6 tabs/60 mg on day 1, decrease by 1 tablet until complete-6, 5, 4, 3, 2, 1-take with food in the morning if you are able You may use flexeril as needed to help with pain. This is a muscle relaxer and causes sedation- please use only at bedtime or when you will be home and not have to drive/work May continue with tramadol for severe pain Avoid complete bedrest, avoid heavy lifting  Follow-up with sports medicine as planned
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
## 2020-03-12 ENCOUNTER — Ambulatory Visit (INDEPENDENT_AMBULATORY_CARE_PROVIDER_SITE_OTHER): Payer: BC Managed Care – PPO | Admitting: Family Medicine

## 2020-03-12 ENCOUNTER — Encounter: Payer: Self-pay | Admitting: Family Medicine

## 2020-03-12 VITALS — BP 122/84 | HR 88 | Ht 66.0 in | Wt 158.4 lb

## 2020-03-12 DIAGNOSIS — M545 Low back pain: Secondary | ICD-10-CM

## 2020-03-12 DIAGNOSIS — G8929 Other chronic pain: Secondary | ICD-10-CM
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
## 2020-03-12 NOTE — Patient Instructions (Signed)
Thank you for coming in today. Plan for MRI.  Recheck with me following MRI.  Try adding a TENS unit.   TENS UNIT: This is helpful for muscle pain and spasm.   Search and Purchase a TENS 7000 2nd edition at  www.tenspros.com or www.Amazon.com It should be less than $30.     TENS unit instructions: Do not shower or bathe with the unit on Turn the unit off before removing electrodes or batteries If the electrodes lose stickiness add a drop of water to the electrodes after they are disconnected from the unit and place on plastic sheet. If you continued to have difficulty, call the TENS unit company to purchase more electrodes. Do not apply lotion on the skin area prior to use. Make sure the skin is clean and dry as this will help prolong the life of the electrodes. After use, always check skin for unusual red areas, rash or other skin difficulties. If there are any skin problems, does not apply electrodes to the same area. Never remove the electrodes from the unit by pulling the wires. Do not use the TENS unit or electrodes other than as directed. Do not change electrode placement without consultating your therapist or physician. Keep 2 fingers with between each electrode. Wear time ratio is 2:1, on to off times.    For example on for 30 minutes off for 15 minutes and then on for 30 minutes off for 15 minutes

## 2020-03-17 ENCOUNTER — Other Ambulatory Visit: Payer: Self-pay

## 2020-03-17 ENCOUNTER — Ambulatory Visit (INDEPENDENT_AMBULATORY_CARE_PROVIDER_SITE_OTHER): Payer: BC Managed Care – PPO

## 2020-03-17 DIAGNOSIS — M545 Low back pain: Secondary | ICD-10-CM

## 2020-03-17 DIAGNOSIS — G8929 Other chronic pain: Secondary | ICD-10-CM | POA: Diagnosis not present
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
## 2020-03-18 NOTE — Progress Notes (Signed)
MRI looks pretty normal.  I think the main issue is probably muscular.  Recommend recheck in clinic to discuss results in full detail.  We will discuss treatment plan at that point as well.

## 2020-05-09 ENCOUNTER — Encounter (HOSPITAL_COMMUNITY): Payer: Self-pay | Admitting: Emergency Medicine

## 2020-05-09 ENCOUNTER — Other Ambulatory Visit: Payer: Self-pay

## 2020-05-09 ENCOUNTER — Ambulatory Visit (HOSPITAL_COMMUNITY)
Admission: EM | Admit: 2020-05-09 | Discharge: 2020-05-09 | Disposition: A | Discharge status: Home / Self Care | Payer: PRIVATE HEALTH INSURANCE | Attending: Family Medicine | Admitting: Family Medicine

## 2020-05-09 DIAGNOSIS — M5441 Lumbago with sciatica, right side: Secondary | ICD-10-CM | POA: Diagnosis not present

## 2020-05-09 DIAGNOSIS — G8929 Other chronic pain: Secondary | ICD-10-CM

## 2020-05-09 MED ORDER — KETOROLAC TROMETHAMINE 60 MG/2ML IM SOLN
60.0000 mg | Freq: Once | INTRAMUSCULAR | Status: AC
  Administered 2020-05-09: 60 mg via INTRAMUSCULAR

## 2020-05-09 MED ORDER — PREDNISONE 10 MG PO TABS: ORAL_TABLET | ORAL | 0 refills | Status: DC

## 2020-05-09 MED ORDER — CYCLOBENZAPRINE HCL 5 MG PO TABS: 5.0000 mg | ORAL_TABLET | Freq: Two times a day (BID) | ORAL | 0 refills | Status: DC | PRN

## 2020-05-09 MED ORDER — KETOROLAC TROMETHAMINE 60 MG/2ML IM SOLN
INTRAMUSCULAR | Status: AC
  Filled 2020-05-09: qty 2
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
## 2020-05-09 NOTE — ED Triage Notes (Addendum)
Pt c/o chronic back pain since 2018. Pt states he is no longer going to physical therapy, he states he didn't feel like it was helping. Pt states he did have an MRI but never followed up with the doctor after. Pt states he needs refills on cyclobenzaprine, prednisone, and tramadol.

## 2020-05-14 ENCOUNTER — Ambulatory Visit: Payer: BC Managed Care – PPO | Admitting: Family Medicine

## 2020-06-09 ENCOUNTER — Other Ambulatory Visit: Payer: Self-pay

## 2020-06-09 ENCOUNTER — Ambulatory Visit
Admission: RE | Admit: 2020-06-09 | Discharge: 2020-06-09 | Disposition: A | Discharge status: Home / Self Care | Payer: PRIVATE HEALTH INSURANCE | Source: Ambulatory Visit | Attending: Physician Assistant | Admitting: Physician Assistant

## 2020-06-09 VITALS — BP 121/78 | HR 87 | Temp 99.9°F | Resp 16

## 2020-06-09 DIAGNOSIS — R059 Cough, unspecified: Secondary | ICD-10-CM | POA: Insufficient documentation

## 2020-06-09 DIAGNOSIS — R509 Fever, unspecified: Secondary | ICD-10-CM | POA: Diagnosis present

## 2020-06-09 DIAGNOSIS — Z1152 Encounter for screening for COVID-19: Secondary | ICD-10-CM | POA: Diagnosis not present

## 2020-06-09 LAB — POCT RAPID STREP A (OFFICE): Rapid Strep A Screen: NEGATIVE

## 2020-06-09 MED ORDER — AMOXICILLIN 500 MG PO CAPS: 500.0000 mg | ORAL_CAPSULE | Freq: Two times a day (BID) | ORAL | 0 refills | Status: DC

## 2020-06-09 NOTE — Discharge Instructions (Signed)
Rapid strep negative. However, given your exam, will cover you empirically for bacterial infection with amoxicillin. As discussed, symptoms can still be due to viral illness/ drainage down your throat. This usually takes 7-10 days to resolve. COVID testing ordered, please quarantine until testing results return. Continue ibuprofen for body aches/fever. Monitor for any worsening of symptoms, swelling of the throat, trouble breathing, trouble swallowing, leaning forward to breath, drooling, go to the emergency department for further evaluation needed.

## 2020-06-09 NOTE — ED Triage Notes (Signed)
Pt states he has had intermittent body aches, chills, headaches, and fevers for 2-3 days. Pt is aox4 and ambulatory.

## 2020-06-09 NOTE — ED Provider Notes (Signed)
[facility]    CSN: [id number] Arrival date & time: 06/09/20  1115      History   Chief Complaint Chief Complaint  Patient presents with  . Fever    x 2 days  . Headache    x 2 days intermittent    HPI Brett Henry is a 29 y.o. male.   29 year old male comes in for 2-3 day of URI symptoms. Body aches, chills, headache, cough, diarrhea, sore throat. Denies rhinorrhea, nasal congestion. Tmax 102, responsive to antipyretic. Denies abdominal pain, nausea, vomiting. Denies shortness of breath, loss of taste/smell. Current smoker. Has not needed albuterol use. No antipyretic in the last 8 hours     Past Medical History:  Diagnosis Date  . Asthma   . Back pain     Patient Active Problem List   Diagnosis Date Noted  . Tobacco use 04/27/2019  . Exercise-induced asthma 04/27/2019  . Routine general medical examination at a health care facility 04/27/2019    Past Surgical History:  Procedure Laterality Date  . EYE SURGERY Left    age 46        Home Medications    Prior to Admission medications   Medication Sig Start Date End Date Taking? Authorizing Provider  albuterol (VENTOLIN HFA) 108 (90 Base) MCG/ACT inhaler Inhale 2 puffs into the lungs every 6 (six) hours as needed for wheezing or shortness of breath. 04/27/19  Yes Nafziger, Kandee Keen, NP  cyclobenzaprine (FLEXERIL) 5 MG tablet Take 1-2 tablets (5-10 mg total) by mouth 2 (two) times daily as needed for muscle spasms. DO NOT DRINK ALCOHOL OR DRIVE WHILE TAKING THIS MEDICATION 05/09/20  Yes Particia Nearing, PA-C  ibuprofen (ADVIL) 800 MG tablet Take 1 tablet (800 mg total) by mouth every 8 (eight) hours as needed for moderate pain. 01/02/20  Yes Moshe Cipro, NP  amoxicillin (AMOXIL) 500 MG capsule Take 1 capsule (500 mg total) by mouth 2 (two) times daily. 06/09/20   Cathie Hoops, Ladasha Schnackenberg V, PA-C  omeprazole (PRILOSEC) 20 MG capsule TAKE 1 CAPSULE BY MOUTH EVERY DAY 06/27/19   Nafziger, Kandee Keen, NP    traMADol (ULTRAM) 50 MG tablet Take 1 tablet (50 mg total) by mouth every 6 (six) hours as needed. 03/11/20   Wieters, Hallie C, PA-C  tiZANidine (ZANAFLEX) 2 MG tablet TAKE 1 TO 2 TABLETS BY MOUTH EVERY 6 HOURS AS NEEDED FOR MUSCLE SPASMS 08/21/19   Hilts, Casimiro Needle, MD  tiZANidine (ZANAFLEX) 2 MG tablet TAKE 1 TO 2 TABLETS BY MOUTH EVERY 6 HOURS AS NEEDED FOR MUSCLE SPASMS 09/07/19   Hilts, Casimiro Needle, MD    Family History Family History  Problem Relation Age of Onset  . Asthma Mother   . Gout Father   . Diabetes Maternal Grandmother     Social History Social History   Tobacco Use  . Smoking status: Current Every Day Smoker    Packs/day: 1.00    Years: 10.00    Pack years: 10.00    Types: Cigarettes  . Smokeless tobacco: Never Used  Vaping Use  . Vaping Use: Never used  Substance Use Topics  . Alcohol use: Yes    Comment: occasional  . Drug use: No     Allergies   Tylenol [acetaminophen]   Review of Systems Review of Systems  Reason unable to perform ROS: See HPI as above.     Physical Exam Triage Vital Signs ED Triage Vitals  Enc Vitals Group     BP 06/09/20 1150  121/78     Pulse Rate 06/09/20 1150 87     Resp 06/09/20 1150 16     Temp 06/09/20 1150 99.9 F (37.7 C)     Temp Source 06/09/20 1150 Oral     SpO2 06/09/20 1150 95 %     Weight --      Height --      Head Circumference --      Peak Flow --      Pain Score 06/09/20 1205 10     Pain Loc --      Pain Edu? --      Excl. in GC? --    No data found.  Updated Vital Signs BP 121/78 (BP Location: Left Arm)   Pulse 87   Temp 99.9 F (37.7 C) (Oral)   Resp 16   SpO2 95%   Physical Exam Constitutional:      General: He is not in acute distress.    Appearance: He is well-developed. He is not ill-appearing, toxic-appearing or diaphoretic.  HENT:     Head: Normocephalic and atraumatic.     Right Ear: Tympanic membrane, ear canal and external ear normal. Tympanic membrane is not erythematous or  bulging.     Left Ear: Tympanic membrane, ear canal and external ear normal. Tympanic membrane is not erythematous or bulging.     Nose:     Right Sinus: No maxillary sinus tenderness or frontal sinus tenderness.     Left Sinus: No maxillary sinus tenderness or frontal sinus tenderness.     Mouth/Throat:     Mouth: Mucous membranes are moist.     Pharynx: Oropharynx is clear. Uvula midline. Posterior oropharyngeal erythema present.     Tonsils: Tonsillar exudate present. 2+ on the right. 2+ on the left.  Eyes:     Conjunctiva/sclera: Conjunctivae normal.     Pupils: Pupils are equal, round, and reactive to light.  Cardiovascular:     Rate and Rhythm: Normal rate and regular rhythm.  Pulmonary:     Effort: Pulmonary effort is normal. No accessory muscle usage, prolonged expiration, respiratory distress or retractions.     Breath sounds: No decreased air movement or transmitted upper airway sounds. No decreased breath sounds.     Comments: LCTAB Musculoskeletal:     Cervical back: Normal range of motion and neck supple.  Skin:    General: Skin is warm and dry.  Neurological:     Mental Status: He is alert and oriented to person, place, and time.      UC Treatments / Results  Labs (all labs ordered are listed, but only abnormal results are displayed) Labs Reviewed  NOVEL CORONAVIRUS, NAA  CULTURE, GROUP A STREP [facility])  POCT RAPID STREP A (OFFICE)    EKG   Radiology No results found.  Procedures Procedures (including critical care time)  Medications Ordered in UC Medications - No data to display  Initial Impression / Assessment and Plan / UC Course  I have reviewed the triage vital signs and the nursing notes.  Pertinent labs & imaging results that were available during my care of the patient were reviewed by me and considered in my medical decision making (see chart for details).    Rapid strep negative.  However, given history and exam, will cover for tonsillitis  with amoxicillin. COVID testing ordered, to quarantine until testing results return. Discussed cannot rule out flu causing symptoms, however, patient with stable vitals, reassuring exam, and with 2-3 days of symptoms,  no indications for tamiflu at this time. Other symptomatic treatment discussed.  Return precautions given.  Patient expresses understanding and agrees to plan.  Final Clinical Impressions(s) / UC Diagnoses   Final diagnoses:  Encounter for screening for COVID-19  Cough  Fever, unspecified    ED Prescriptions    Medication Sig Dispense Auth. Provider   amoxicillin (AMOXIL) 500 MG capsule Take 1 capsule (500 mg total) by mouth 2 (two) times daily. 20 capsule Belinda Fisher, PA-C     PDMP not reviewed this encounter.   Belinda Fisher, PA-C 06/09/20 1248

## 2020-06-10 LAB — NOVEL CORONAVIRUS, NAA: SARS-CoV-2, NAA: NOT DETECTED

## 2020-06-10 LAB — SARS-COV-2, NAA 2 DAY TAT

## 2020-06-13 LAB — CULTURE, GROUP A STREP (THRC)

## 2020-07-30 ENCOUNTER — Other Ambulatory Visit: Payer: Self-pay | Admitting: Adult Health

## 2020-07-30 DIAGNOSIS — J45990 Exercise induced bronchospasm: Secondary | ICD-10-CM

## 2020-08-05 ENCOUNTER — Ambulatory Visit
Admission: RE | Admit: 2020-08-05 | Discharge: 2020-08-05 | Disposition: A | Discharge status: Home / Self Care | Payer: PRIVATE HEALTH INSURANCE | Source: Ambulatory Visit | Attending: Urgent Care | Admitting: Urgent Care

## 2020-08-05 ENCOUNTER — Other Ambulatory Visit: Payer: Self-pay

## 2020-08-05 VITALS — BP 111/72 | HR 77 | Temp 98.0°F | Resp 19

## 2020-08-05 DIAGNOSIS — S61011A Laceration without foreign body of right thumb without damage to nail, initial encounter: Secondary | ICD-10-CM

## 2020-08-05 DIAGNOSIS — Z23 Encounter for immunization: Secondary | ICD-10-CM

## 2020-08-05 DIAGNOSIS — M79641 Pain in right hand: Secondary | ICD-10-CM | POA: Diagnosis not present

## 2020-08-05 MED ORDER — TETANUS-DIPHTH-ACELL PERTUSSIS 5-2.5-18.5 LF-MCG/0.5 IM SUSY
0.5000 mL | PREFILLED_SYRINGE | Freq: Once | INTRAMUSCULAR | Status: AC
  Administered 2020-08-05: 0.5 mL via INTRAMUSCULAR

## 2020-08-05 NOTE — Discharge Instructions (Addendum)
Please change your dressing 2-3 times daily. Do not apply any ointments or creams. Each time you change your dressing, make sure you clean gently around the perimeter of the wound with gentle soap and warm water. Pat your wound dry and let it air out if possible for 1-2 hours before reapplying another dressing. Cover your wound at work. It is ok to leave it uncovered at home if you will not be using your hand and can keep it clean. You may take ibuprofen 400-600mg  every 6 hours for aches, pains, fevers and general inflammation.

## 2020-08-05 NOTE — ED Triage Notes (Signed)
Pt presents with complaints of avulsion laceration that occurred yesterday. Bleeding controlled. tdap shot is not up to date.

## 2020-08-05 NOTE — ED Provider Notes (Signed)
[facility]   MRN: [id number] DOB: 04/21/1991  Subjective:   Brett Henry is a 29 y.o. male presenting for 1 day history of suffering a right thumb laceration using a mandolin.  Patient covered his wound and came to our clinic today.  His last Tdap was administered more than 10 years ago.  Denies loss of range of motion.  Denies fever, drainage of pus, bleeding.  No current facility-administered medications for this encounter.  Current Outpatient Medications:    albuterol (VENTOLIN HFA) 108 (90 Base) MCG/ACT inhaler, TAKE 2 PUFFS BY MOUTH EVERY 6 HOURS AS NEEDED FOR WHEEZE OR SHORTNESS OF BREATH, Disp: 18 each, Rfl: 0   amoxicillin (AMOXIL) 500 MG capsule, Take 1 capsule (500 mg total) by mouth 2 (two) times daily., Disp: 20 capsule, Rfl: 0   cyclobenzaprine (FLEXERIL) 5 MG tablet, Take 1-2 tablets (5-10 mg total) by mouth 2 (two) times daily as needed for muscle spasms. DO NOT DRINK ALCOHOL OR DRIVE WHILE TAKING THIS MEDICATION, Disp: 24 tablet, Rfl: 0   ibuprofen (ADVIL) 800 MG tablet, Take 1 tablet (800 mg total) by mouth every 8 (eight) hours as needed for moderate pain., Disp: 21 tablet, Rfl: 0   omeprazole (PRILOSEC) 20 MG capsule, TAKE 1 CAPSULE BY MOUTH EVERY DAY, Disp: 90 capsule, Rfl: 1   traMADol (ULTRAM) 50 MG tablet, Take 1 tablet (50 mg total) by mouth every 6 (six) hours as needed., Disp: 10 tablet, Rfl: 0   Allergies  Allergen Reactions   Tylenol [Acetaminophen] Anaphylaxis    Past Medical History:  Diagnosis Date   Asthma    Back pain      Past Surgical History:  Procedure Laterality Date   EYE SURGERY Left    age 41     Family History  Problem Relation Age of Onset   Asthma Mother    Gout Father    Diabetes Maternal Grandmother     Social History   Tobacco Use   Smoking status: Current Every Day Smoker    Packs/day: 1.00    Years: 10.00    Pack years: 10.00    Types: Cigarettes   Smokeless tobacco: Never Used   Building services engineer Use: Never used  Substance Use Topics   Alcohol use: Yes    Comment: occasional   Drug use: No    ROS   Objective:   Vitals: BP 111/72    Pulse 77    Temp 98 F (36.7 C)    Resp 19    SpO2 96%   Physical Exam Constitutional:      General: He is not in acute distress.    Appearance: Normal appearance. He is well-developed and normal weight. He is not ill-appearing, toxic-appearing or diaphoretic.  HENT:     Head: Normocephalic and atraumatic.     Right Ear: External ear normal.     Left Ear: External ear normal.     Nose: Nose normal.     Mouth/Throat:     Pharynx: Oropharynx is clear.  Eyes:     General: No scleral icterus.       Right eye: No discharge.        Left eye: No discharge.     Extraocular Movements: Extraocular movements intact.     Pupils: Pupils are equal, round, and reactive to light.  Cardiovascular:     Rate and Rhythm: Normal rate.  Pulmonary:     Effort: Pulmonary effort is normal.  Musculoskeletal:  Hands:     Cervical back: Normal range of motion.  Neurological:     Mental Status: He is alert and oriented to person, place, and time.  Psychiatric:        Mood and Affect: Mood normal.        Behavior: Behavior normal.        Thought Content: Thought content normal.        Judgment: Judgment normal.       Assessment and Plan :   PDMP not reviewed this encounter.  1. Right hand pain   2. Laceration of right thumb without foreign body without damage to nail, initial encounter     Wound care performed, bacitracin applied to the wound.  Tdap updated.  Use ibuprofen for pain control. Counseled patient on potential for adverse effects with medications prescribed/recommended today, ER and return-to-clinic precautions discussed, patient verbalized understanding.    Wallis Bamberg, PA-C 08/05/20 1719

## 2021-01-27 ENCOUNTER — Encounter: Payer: Self-pay | Admitting: Emergency Medicine

## 2021-01-27 ENCOUNTER — Ambulatory Visit
Admission: EM | Admit: 2021-01-27 | Discharge: 2021-01-27 | Disposition: A | Discharge status: Home / Self Care | Payer: PRIVATE HEALTH INSURANCE | Attending: Emergency Medicine | Admitting: Emergency Medicine

## 2021-01-27 ENCOUNTER — Other Ambulatory Visit: Payer: Self-pay

## 2021-01-27 DIAGNOSIS — R109 Unspecified abdominal pain: Secondary | ICD-10-CM | POA: Diagnosis not present

## 2021-01-27 MED ORDER — NAPROXEN 500 MG PO TABS: 500.0000 mg | ORAL_TABLET | Freq: Two times a day (BID) | ORAL | 0 refills | Status: DC

## 2021-01-27 MED ORDER — TIZANIDINE HCL 2 MG PO TABS: 2.0000 mg | ORAL_TABLET | Freq: Four times a day (QID) | ORAL | 0 refills | Status: DC | PRN

## 2021-01-27 NOTE — Discharge Instructions (Addendum)
Naprosyn twice daily with food for pain Supplement tizanidine at home/bedtime-this is muscle relaxer, may cause drowsiness, do not drive or work after taking Alternate ice and heat to area Continue to monitor, follow-up if not improving or worsening

## 2021-01-27 NOTE — ED Provider Notes (Signed)
[facility]    CSN: [id number] Arrival date & time: 01/27/21  0818      History   Chief Complaint Chief Complaint  Patient presents with   rib pain    HPI Brett Henry is a 30 y.o. male history of tobacco use presenting today for evaluation of right side pain.  Reports over the past 3 days has developed discomfort in his right lower rib cage.  Reports pain with twisting bending as well as deep breaths/coughing.  Reports that he has a cough at baseline and this is not changed recently.  Denies fevers, shortness of breath.  He denies abdominal pain nausea or vomiting.  Denies urinary symptoms of dysuria, hesitancy, hematuria.  Denies history of stones.  Has not used any medicines for symptoms.  HPI  Past Medical History:  Diagnosis Date   Asthma    Back pain     Patient Active Problem List   Diagnosis Date Noted   Tobacco use 04/27/2019   Exercise-induced asthma 04/27/2019   Routine general medical examination at a health care facility 04/27/2019    Past Surgical History:  Procedure Laterality Date   EYE SURGERY Left    age 68        Home Medications    Prior to Admission medications   Medication Sig Start Date End Date Taking? Authorizing Provider  naproxen (NAPROSYN) 500 MG tablet Take 1 tablet (500 mg total) by mouth 2 (two) times daily. 01/27/21  Yes Jiraiya Mcewan C, PA-C  tiZANidine (ZANAFLEX) 2 MG tablet Take 1-2 tablets (2-4 mg total) by mouth every 6 (six) hours as needed for muscle spasms. 01/27/21  Yes Rhenda [place] C, PA-C  albuterol (VENTOLIN HFA) 108 (90 Base) MCG/ACT inhaler TAKE 2 PUFFS BY MOUTH EVERY 6 HOURS AS NEEDED FOR WHEEZE OR SHORTNESS OF BREATH 07/30/20   Nafziger, Kandee Keen, NP  omeprazole (PRILOSEC) 20 MG capsule TAKE 1 CAPSULE BY MOUTH EVERY DAY 06/27/19   Nafziger, Kandee Keen, NP    Family History Family History  Problem Relation Age of Onset   Asthma Mother    Gout Father    Diabetes Maternal Grandmother     Social  History Social History   Tobacco Use   Smoking status: Every Day    Packs/day: 1.00    Years: 10.00    Pack years: 10.00    Types: Cigarettes   Smokeless tobacco: Never  Vaping Use   Vaping Use: Never used  Substance Use Topics   Alcohol use: Yes    Comment: occasional   Drug use: No     Allergies   Tylenol [acetaminophen]   Review of Systems Review of Systems  Constitutional:  Negative for fever.  HENT:  Negative for sore throat.   Respiratory:  Negative for shortness of breath.   Cardiovascular:  Negative for chest pain.  Gastrointestinal:  Negative for abdominal pain, nausea and vomiting.  Genitourinary:  Positive for flank pain. Negative for difficulty urinating, dysuria, frequency, penile discharge, penile pain, penile swelling, scrotal swelling and testicular pain.  Skin:  Negative for rash.  Neurological:  Negative for dizziness, light-headedness and headaches.    Physical Exam Triage Vital Signs ED Triage Vitals  Enc Vitals Group     BP      Pulse      Resp      Temp      Temp src      SpO2      Weight      Height  Head Circumference      Peak Flow      Pain Score      Pain Loc      Pain Edu?      Excl. in GC?    No data found.  Updated Vital Signs BP 120/71 (BP Location: Left Arm)   Pulse 70   Temp 98.1 F (36.7 C) (Oral)   Resp 18   SpO2 98%   Visual Acuity Right Eye Distance:   Left Eye Distance:   Bilateral Distance:    Right Eye Near:   Left Eye Near:    Bilateral Near:     Physical Exam Vitals and nursing note reviewed.  Constitutional:      Appearance: He is well-developed.     Comments: No acute distress  HENT:     Head: Normocephalic and atraumatic.     Nose: Nose normal.  Eyes:     Conjunctiva/sclera: Conjunctivae normal.  Cardiovascular:     Rate and Rhythm: Normal rate and regular rhythm.  Pulmonary:     Effort: Pulmonary effort is normal. No respiratory distress.     Comments: Breathing comfortably at  rest, CTABL, no wheezing, rales or other adventitious sounds auscultated  Abdominal:     General: There is no distension.  Musculoskeletal:        General: Normal range of motion.     Cervical back: Neck supple.     Comments: Right lower rib cage with tenderness to palpation to lower ribs to far lateral lower thoracic area extending into flank and slightly anteriorly, no palpable step-off  Skin:    General: Skin is warm and dry.     Comments: No rash noted  Neurological:     Mental Status: He is alert and oriented to person, place, and time.     UC Treatments / Results  Labs (all labs ordered are listed, but only abnormal results are displayed) Labs Reviewed - No data to display  EKG   Radiology No results found.  Procedures Procedures (including critical care time)  Medications Ordered in UC Medications - No data to display  Initial Impression / Assessment and Plan / UC Course  I have reviewed the triage vital signs and the nursing notes.  Pertinent labs & imaging results that were available during my care of the patient were reviewed by me and considered in my medical decision making (see chart for details).     Right flank/right thoracic rib pain, no mechanism of injury, lungs clear to auscultation, suspect likely MSK etiology and recommending anti-inflammatories and muscle relaxers, ice and heat and monitoring.  Discussed strict return precautions. Patient verbalized understanding and is agreeable with plan.  Final Clinical Impressions(s) / UC Diagnoses   Final diagnoses:  Right flank pain     Discharge Instructions      Naprosyn twice daily with food for pain Supplement tizanidine at home/bedtime-this is muscle relaxer, may cause drowsiness, do not drive or work after taking Alternate ice and heat to area Continue to monitor, follow-up if not improving or worsening     ED Prescriptions     Medication Sig Dispense Auth. Provider   naproxen  (NAPROSYN) 500 MG tablet Take 1 tablet (500 mg total) by mouth 2 (two) times daily. 30 tablet Ilanna Deihl C, PA-C   tiZANidine (ZANAFLEX) 2 MG tablet Take 1-2 tablets (2-4 mg total) by mouth every 6 (six) hours as needed for muscle spasms. 30 tablet Yonas Bunda, [place] C, PA-C  PDMP not reviewed this encounter.   Lew Dawes, [place] 01/27/21 [phone]

## 2021-01-27 NOTE — ED Triage Notes (Signed)
Pt here for right sided rib pain worse with inspiration and cough x 3 days

## 2021-02-12 ENCOUNTER — Other Ambulatory Visit: Payer: Self-pay

## 2021-02-13 ENCOUNTER — Other Ambulatory Visit (HOSPITAL_COMMUNITY): Payer: Self-pay

## 2021-02-13 ENCOUNTER — Encounter: Payer: Self-pay | Admitting: Adult Health

## 2021-02-13 ENCOUNTER — Ambulatory Visit (INDEPENDENT_AMBULATORY_CARE_PROVIDER_SITE_OTHER): Payer: No Typology Code available for payment source | Admitting: Adult Health

## 2021-02-13 VITALS — BP 110/80 | HR 86 | Temp 98.2°F | Ht 66.0 in | Wt 150.0 lb

## 2021-02-13 DIAGNOSIS — K2100 Gastro-esophageal reflux disease with esophagitis, without bleeding: Secondary | ICD-10-CM

## 2021-02-13 DIAGNOSIS — M545 Low back pain, unspecified: Secondary | ICD-10-CM

## 2021-02-13 DIAGNOSIS — Z0000 Encounter for general adult medical examination without abnormal findings: Secondary | ICD-10-CM | POA: Diagnosis not present

## 2021-02-13 DIAGNOSIS — J45990 Exercise induced bronchospasm: Secondary | ICD-10-CM | POA: Diagnosis not present

## 2021-02-13 DIAGNOSIS — G8929 Other chronic pain: Secondary | ICD-10-CM

## 2021-02-13 LAB — CBC WITH DIFFERENTIAL/PLATELET
Basophils Absolute: 0.1 10*3/uL (ref 0.0–0.1)
Basophils Relative: 0.6 % (ref 0.0–3.0)
Eosinophils Absolute: 0.6 10*3/uL (ref 0.0–0.7)
Eosinophils Relative: 7.3 % — ABNORMAL HIGH (ref 0.0–5.0)
HCT: 43.3 % (ref 39.0–52.0)
Hemoglobin: 14.9 g/dL (ref 13.0–17.0)
Lymphocytes Relative: 25.4 % (ref 12.0–46.0)
Lymphs Abs: 2.2 10*3/uL (ref 0.7–4.0)
MCHC: 34.4 g/dL (ref 30.0–36.0)
MCV: 91.3 fl (ref 78.0–100.0)
Monocytes Absolute: 0.5 10*3/uL (ref 0.1–1.0)
Monocytes Relative: 6 % (ref 3.0–12.0)
Neutro Abs: 5.2 10*3/uL (ref 1.4–7.7)
Neutrophils Relative %: 60.7 % (ref 43.0–77.0)
Platelets: 182 10*3/uL (ref 150.0–400.0)
RBC: 4.74 Mil/uL (ref 4.22–5.81)
RDW: 13.5 % (ref 11.5–15.5)
WBC: 8.5 10*3/uL (ref 4.0–10.5)

## 2021-02-13 LAB — LIPID PANEL
Cholesterol: 178 mg/dL (ref 0–200)
HDL: 39.1 mg/dL (ref >39.00)
LDL Cholesterol: 114 mg/dL — ABNORMAL HIGH (ref 0–99)
NonHDL: 139.12
Total CHOL/HDL Ratio: 5
Triglycerides: 124 mg/dL (ref 0.0–149.0)
VLDL: 24.8 mg/dL (ref 0.0–40.0)

## 2021-02-13 LAB — COMPREHENSIVE METABOLIC PANEL
ALT: 16 U/L (ref 0–53)
AST: 14 U/L (ref 0–37)
Albumin: 4.6 g/dL (ref 3.5–5.2)
Alkaline Phosphatase: 64 U/L (ref 39–117)
BUN: 12 mg/dL (ref 6–23)
CO2: 23 mEq/L (ref 19–32)
Calcium: 9.2 mg/dL (ref 8.4–10.5)
Chloride: 107 mEq/L (ref 96–112)
Creatinine, Ser: 1.05 mg/dL (ref 0.40–1.50)
GFR: 94.73 mL/min (ref >60.00)
Glucose, Bld: 87 mg/dL (ref 70–99)
Potassium: 3.9 mEq/L (ref 3.5–5.1)
Sodium: 139 mEq/L (ref 135–145)
Total Bilirubin: 0.3 mg/dL (ref 0.2–1.2)
Total Protein: 7.5 g/dL (ref 6.0–8.3)

## 2021-02-13 LAB — TSH: TSH: 0.81 u[IU]/mL (ref 0.35–5.50)

## 2021-02-13 MED ORDER — OMEPRAZOLE 20 MG PO CPDR
20.0000 mg | DELAYED_RELEASE_CAPSULE | Freq: Every day | ORAL | 2 refills | Status: DC
  Filled 2021-02-13: qty 90, 90d supply, fill #0

## 2021-02-13 MED ORDER — TRAMADOL HCL 50 MG PO TABS
50.0000 mg | ORAL_TABLET | Freq: Three times a day (TID) | ORAL | 0 refills | Status: AC | PRN
  Filled 2021-02-13: qty 15, 5d supply, fill #0

## 2021-02-13 MED ORDER — TIZANIDINE HCL 2 MG PO TABS
2.0000 mg | ORAL_TABLET | Freq: Four times a day (QID) | ORAL | 0 refills | Status: DC | PRN
  Filled 2021-02-13: qty 30, 8d supply, fill #0

## 2021-02-13 MED ORDER — NAPROXEN 500 MG PO TABS
500.0000 mg | ORAL_TABLET | Freq: Two times a day (BID) | ORAL | 0 refills | Status: DC
  Filled 2021-02-13: qty 30, 15d supply, fill #0

## 2021-02-13 MED ORDER — ALBUTEROL SULFATE HFA 108 (90 BASE) MCG/ACT IN AERS
2.0000 | INHALATION_SPRAY | Freq: Four times a day (QID) | RESPIRATORY_TRACT | 0 refills | Status: DC | PRN
  Filled 2021-02-13: qty 18, 25d supply, fill #0

## 2021-02-13 NOTE — Progress Notes (Signed)
Subjective:    Patient ID: Brett Henry, male    DOB: 1991-05-29, 30 y.o.   MRN: [id number]  HPI Patient presents for yearly preventative medicine examination. He is a pleasant 30 year old male who  has a past medical history of Asthma and Back pain.  Exercise Induced asthma - uses albuterol inhaler PRN. He denies any recent flares.   GERD - takes Prilosec 20 mg daily.   Chronic lumbar back pain - since 2018. He works in [employer] and works long hours. Has pain that he takes NSAIDS and tylenol for as needed. Periodically he will need tramadol and a MSK relaxer. Had a normal lumbar spine MRI in 03/2020   All immunizations and health maintenance protocols were reviewed with the patient and needed orders were placed.  Appropriate screening laboratory values were ordered for the patient including screening of hyperlipidemia, renal function and hepatic function.  Medication reconciliation,  past medical history, social history, problem list and allergies were reviewed in detail with the patient  Goals were established with regard to weight loss, exercise, and  diet in compliance with medications  Review of Systems  Constitutional: Negative.   HENT: Negative.    Eyes: Negative.   Respiratory: Negative.    Cardiovascular: Negative.   Gastrointestinal: Negative.   Endocrine: Negative.   Genitourinary: Negative.   Musculoskeletal:  Positive for back pain.  Skin: Negative.   Allergic/Immunologic: Negative.   Neurological: Negative.   Hematological: Negative.   Psychiatric/Behavioral: Negative.    All other systems reviewed and are negative.  Past Medical History:  Diagnosis Date   Asthma    Back pain     Social History   Socioeconomic History   Marital status: Married    Spouse name: Not on file   Number of children: Not on file   Years of education: Not on file   Highest education level: Not on file  Occupational History   Not on file  Tobacco Use   Smoking  status: Every Day    Packs/day: 1.00    Years: 10.00    Pack years: 10.00    Types: Cigarettes   Smokeless tobacco: Never  Vaping Use   Vaping Use: Never used  Substance and Sexual Activity   Alcohol use: Yes    Comment: occasional   Drug use: No   Sexual activity: Never  Other Topics Concern   Not on file  Social History Narrative   Not currently working    Social Determinants of Health   Financial Resource Strain: Not on file  Food Insecurity: Not on file  Transportation Needs: Not on file  Physical Activity: Not on file  Stress: Not on file  Social Connections: Not on file  Intimate Partner Violence: Not on file    Past Surgical History:  Procedure Laterality Date   EYE SURGERY Left    age 81     Family History  Problem Relation Age of Onset   Asthma Mother    Gout Father    Diabetes Maternal Grandmother     Allergies  Allergen Reactions   Tylenol [Acetaminophen] Anaphylaxis    No current outpatient medications on file prior to visit.   No current facility-administered medications on file prior to visit.    BP 110/80   Pulse 86   Temp 98.2 F (36.8 C) (Oral)   Ht 5\' 6"  (1.676 m)   Wt 150 lb (68 kg)   SpO2 97%   BMI 24.21 kg/m  Objective:   Physical Exam Vitals and nursing note reviewed.  Constitutional:      General: He is not in acute distress.    Appearance: Normal appearance. He is well-developed and normal weight.  HENT:     Head: Normocephalic and atraumatic.     Right Ear: Tympanic membrane, ear canal and external ear normal. There is no impacted cerumen.     Left Ear: Tympanic membrane, ear canal and external ear normal. There is no impacted cerumen.     Nose: Nose normal. No congestion or rhinorrhea.     Mouth/Throat:     Mouth: Mucous membranes are moist.     Pharynx: Oropharynx is clear. No oropharyngeal exudate or posterior oropharyngeal erythema.  Eyes:     General:        Right eye: No discharge.        Left eye:  No discharge.     Extraocular Movements: Extraocular movements intact.     Conjunctiva/sclera: Conjunctivae normal.     Pupils: Pupils are equal, round, and reactive to light.  Neck:     Vascular: No carotid bruit.     Trachea: No tracheal deviation.  Cardiovascular:     Rate and Rhythm: Normal rate and regular rhythm.     Pulses: Normal pulses.     Heart sounds: Normal heart sounds. No murmur heard.   No friction rub. No gallop.  Pulmonary:     Effort: Pulmonary effort is normal. No respiratory distress.     Breath sounds: Normal breath sounds. No stridor. No wheezing, rhonchi or rales.  Chest:     Chest wall: No tenderness.  Abdominal:     General: Bowel sounds are normal. There is no distension.     Palpations: Abdomen is soft. There is no mass.     Tenderness: There is no abdominal tenderness. There is no right CVA tenderness, left CVA tenderness, guarding or rebound.     Hernia: No hernia is present.  Musculoskeletal:        General: No swelling, tenderness, deformity or signs of injury. Normal range of motion.     Right lower leg: No edema.     Left lower leg: No edema.  Lymphadenopathy:     Cervical: No cervical adenopathy.  Skin:    General: Skin is warm and dry.     Capillary Refill: Capillary refill takes less than 2 seconds.     Coloration: Skin is not jaundiced or pale.     Findings: No bruising, erythema, lesion or rash.  Neurological:     General: No focal deficit present.     Mental Status: He is alert and oriented to person, place, and time.     Cranial Nerves: No cranial nerve deficit.     Sensory: No sensory deficit.     Motor: No weakness.     Coordination: Coordination normal.     Gait: Gait normal.     Deep Tendon Reflexes: Reflexes normal.  Psychiatric:        Mood and Affect: Mood normal.        Behavior: Behavior normal.        Thought Content: Thought content normal.        Judgment: Judgment normal.       Assessment & Plan:  1. Routine  general medical examination at a health care facility - Stay hydrated while working outside - Follow up in one year or sooner if needed - CBC with Differential/Platelet; Future - Comprehensive  metabolic panel; Future - Lipid panel; Future - TSH; Future - TSH - Lipid panel - Comprehensive metabolic panel - CBC with Differential/Platelet  2. Exercise-induced asthma  - albuterol (VENTOLIN HFA) 108 (90 Base) MCG/ACT inhaler; Inhale 2 puffs into the lungs every 6 (six) hours as needed for shortness of breath and wheezing.  Dispense: 18 g; Refill: 0  3. Gastroesophageal reflux disease with esophagitis  - omeprazole (PRILOSEC) 20 MG capsule; Take 1 capsule (20 mg total) by mouth daily.  Dispense: 90 capsule; Refill: 2  4. Chronic bilateral low back pain without sciatica  - naproxen (NAPROSYN) 500 MG tablet; Take 1 tablet (500 mg total) by mouth 2 (two) times daily.  Dispense: 30 tablet; Refill: 0 - tiZANidine (ZANAFLEX) 2 MG tablet; Take 1 tablet (2 mg total) by mouth every 6 (six) hours as needed for muscle spasms.  Dispense: 30 tablet; Refill: 0 - traMADol (ULTRAM) 50 MG tablet; Take 1 tablet (50 mg total) by mouth every 8 (eight) hours as needed for up to 5 days.  Dispense: 15 tablet; Refill: 0  Shirline Frees, NP

## 2021-02-26 ENCOUNTER — Ambulatory Visit (INDEPENDENT_AMBULATORY_CARE_PROVIDER_SITE_OTHER): Payer: No Typology Code available for payment source | Admitting: Family Medicine

## 2021-02-26 ENCOUNTER — Other Ambulatory Visit: Payer: Self-pay

## 2021-02-26 ENCOUNTER — Other Ambulatory Visit (HOSPITAL_COMMUNITY): Payer: Self-pay

## 2021-02-26 ENCOUNTER — Encounter: Payer: Self-pay | Admitting: Family Medicine

## 2021-02-26 DIAGNOSIS — G8929 Other chronic pain: Secondary | ICD-10-CM

## 2021-02-26 DIAGNOSIS — M545 Low back pain, unspecified: Secondary | ICD-10-CM

## 2021-02-26 MED ORDER — BACLOFEN 10 MG PO TABS
5.0000 mg | ORAL_TABLET | Freq: Every evening | ORAL | 3 refills | Status: DC | PRN
  Filled 2021-02-26: qty 30, 30d supply, fill #0

## 2021-02-26 MED ORDER — VITAMIN D-3 125 MCG (5000 UT) PO TABS
1.0000 | ORAL_TABLET | Freq: Every day | ORAL | 3 refills | Status: AC
  Filled 2021-02-26: qty 90, fill #0
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
## 2021-03-18 ENCOUNTER — Other Ambulatory Visit (HOSPITAL_COMMUNITY): Payer: Self-pay

## 2021-03-18 ENCOUNTER — Emergency Department (HOSPITAL_BASED_OUTPATIENT_CLINIC_OR_DEPARTMENT_OTHER)
Admission: EM | Admit: 2021-03-18 | Discharge: 2021-03-18 | Disposition: A | Discharge status: Home / Self Care | Payer: No Typology Code available for payment source | Attending: Emergency Medicine | Admitting: Emergency Medicine

## 2021-03-18 ENCOUNTER — Encounter (HOSPITAL_BASED_OUTPATIENT_CLINIC_OR_DEPARTMENT_OTHER): Payer: Self-pay | Admitting: *Deleted

## 2021-03-18 ENCOUNTER — Other Ambulatory Visit: Payer: Self-pay

## 2021-03-18 DIAGNOSIS — R197 Diarrhea, unspecified: Secondary | ICD-10-CM | POA: Diagnosis present

## 2021-03-18 DIAGNOSIS — Z87891 Personal history of nicotine dependence: Secondary | ICD-10-CM | POA: Insufficient documentation

## 2021-03-18 DIAGNOSIS — Z20822 Contact with and (suspected) exposure to covid-19: Secondary | ICD-10-CM | POA: Insufficient documentation

## 2021-03-18 DIAGNOSIS — K529 Noninfective gastroenteritis and colitis, unspecified: Secondary | ICD-10-CM | POA: Diagnosis not present

## 2021-03-18 DIAGNOSIS — J45909 Unspecified asthma, uncomplicated: Secondary | ICD-10-CM | POA: Insufficient documentation

## 2021-03-18 DIAGNOSIS — K920 Hematemesis: Secondary | ICD-10-CM | POA: Diagnosis not present

## 2021-03-18 LAB — COMPREHENSIVE METABOLIC PANEL
ALT: 12 U/L (ref 0–44)
AST: 15 U/L (ref 15–41)
Albumin: 4.7 g/dL (ref 3.5–5.0)
Alkaline Phosphatase: 56 U/L (ref 38–126)
Anion gap: 7 (ref 5–15)
BUN: 12 mg/dL (ref 6–20)
CO2: 27 mmol/L (ref 22–32)
Calcium: 9.4 mg/dL (ref 8.9–10.3)
Chloride: 105 mmol/L (ref 98–111)
Creatinine, Ser: 0.95 mg/dL (ref 0.61–1.24)
GFR, Estimated: >60 mL/min (ref >60)
Glucose, Bld: 89 mg/dL (ref 70–99)
Potassium: 3.8 mmol/L (ref 3.5–5.1)
Sodium: 139 mmol/L (ref 135–145)
Total Bilirubin: 0.7 mg/dL (ref 0.3–1.2)
Total Protein: 7.6 g/dL (ref 6.5–8.1)

## 2021-03-18 LAB — CBC
HCT: 44.9 % (ref 39.0–52.0)
Hemoglobin: 15.5 g/dL (ref 13.0–17.0)
MCH: 30.9 pg (ref 26.0–34.0)
MCHC: 34.5 g/dL (ref 30.0–36.0)
MCV: 89.6 fL (ref 80.0–100.0)
Platelets: 196 10*3/uL (ref 150–400)
RBC: 5.01 MIL/uL (ref 4.22–5.81)
RDW: 12.8 % (ref 11.5–15.5)
WBC: 7.9 10*3/uL (ref 4.0–10.5)
nRBC: 0 % (ref 0.0–0.2)

## 2021-03-18 LAB — URINALYSIS, ROUTINE W REFLEX MICROSCOPIC
Bilirubin Urine: NEGATIVE
Glucose, UA: NEGATIVE mg/dL
Hgb urine dipstick: NEGATIVE
Leukocytes,Ua: NEGATIVE
Nitrite: NEGATIVE
Protein, ur: 30 mg/dL — AB
Specific Gravity, Urine: 1.036 — ABNORMAL HIGH (ref 1.005–1.030)
pH: 6 (ref 5.0–8.0)

## 2021-03-18 LAB — RESP PANEL BY RT-PCR (FLU A&B, COVID) ARPGX2
Influenza A by PCR: NEGATIVE
Influenza B by PCR: NEGATIVE
SARS Coronavirus 2 by RT PCR: NEGATIVE

## 2021-03-18 LAB — LIPASE, BLOOD: Lipase: 11 U/L (ref 11–51)

## 2021-03-18 MED ORDER — ONDANSETRON 4 MG PO TBDP
4.0000 mg | ORAL_TABLET | Freq: Three times a day (TID) | ORAL | 0 refills | Status: DC | PRN
  Filled 2021-03-18: qty 20, 7d supply, fill #0

## 2021-03-18 MED ORDER — ONDANSETRON 4 MG PO TBDP
8.0000 mg | ORAL_TABLET | Freq: Once | ORAL | Status: AC
  Administered 2021-03-18: 8 mg via ORAL
  Filled 2021-03-18: qty 2

## 2021-03-18 MED ORDER — ONDANSETRON HCL 4 MG/2ML IJ SOLN
4.0000 mg | Freq: Once | INTRAMUSCULAR | Status: AC
  Administered 2021-03-18: 4 mg via INTRAVENOUS
  Filled 2021-03-18: qty 2

## 2021-03-18 MED ORDER — SODIUM CHLORIDE 0.9 % IV BOLUS
1000.0000 mL | Freq: Once | INTRAVENOUS | Status: AC
  Administered 2021-03-18: 1000 mL via INTRAVENOUS

## 2021-03-18 NOTE — ED Provider Notes (Signed)
[facility] EMERGENCY DEPT Provider Note   CSN: [id number] Arrival date & time: 03/18/21  1038     History Chief Complaint  Patient presents with   Emesis   Diarrhea   Hematemesis    Brett Henry is a 30 y.o. male.  He reports multiple episodes of vomiting and diarrhea over the past few days.  This morning, he had scant bright red hematemesis.  No hematemesis since that point.  He did eat some undercooked hamburger several days ago, and he wonders if this contributed to his symptoms.  No one else around him is sick.  The history is provided by the patient.  Emesis Severity:  Moderate Duration:  2 days Timing:  Intermittent Emesis appearance: stomach contents then one episode of bright red blood (scant amount) today. Progression:  Unchanged Chronicity:  New Relieved by:  Nothing Worsened by:  Nothing Ineffective treatments:  Liquids Associated symptoms: no chills, no cough and no sore throat   Risk factors: suspect food intake (hamburger)       Past Medical History:  Diagnosis Date   Asthma    Back pain     Patient Active Problem List   Diagnosis Date Noted   Tobacco use 04/27/2019   Exercise-induced asthma 04/27/2019   Routine general medical examination at a health care facility 04/27/2019    Past Surgical History:  Procedure Laterality Date   EYE SURGERY Left    age 54        Family History  Problem Relation Age of Onset   Asthma Mother    Gout Father    Diabetes Maternal Grandmother     Social History   Tobacco Use   Smoking status: Former    Packs/day: 1.00    Years: 10.00    Pack years: 10.00    Types: Cigarettes   Smokeless tobacco: Never  Vaping Use   Vaping Use: Never used  Substance Use Topics   Alcohol use: Yes    Comment: occasional   Drug use: No    Home Medications Prior to Admission medications   Medication Sig Start Date End Date Taking? Authorizing Provider  baclofen (LIORESAL) 10 MG tablet Take 0.5-1  tablets (5-10 mg total) by mouth at bedtime as needed for muscle spasms. 02/26/21  Yes Hilts, Casimiro Needle, MD  Cholecalciferol (VITAMIN D-3) 125 MCG (5000 UT) TABS Take 1 tablet by mouth daily. 02/26/21  Yes Hilts, Casimiro Needle, MD  naproxen (NAPROSYN) 500 MG tablet Take 1 tablet (500 mg total) by mouth 2 (two) times daily. 02/13/21  Yes Nafziger, Kandee Keen, NP  omeprazole (PRILOSEC) 20 MG capsule Take 1 capsule (20 mg total) by mouth daily. 02/13/21  Yes Nafziger, Kandee Keen, NP  traMADol (ULTRAM) 50 MG tablet Take by mouth every 6 (six) hours as needed.   Yes [provider]  albuterol (VENTOLIN HFA) 108 (90 Base) MCG/ACT inhaler Inhale 2 puffs into the lungs every 6 (six) hours as needed for shortness of breath and wheezing. 02/13/21   Nafziger, Kandee Keen, NP  tiZANidine (ZANAFLEX) 2 MG tablet Take 1 tablet (2 mg total) by mouth every 6 (six) hours as needed for muscle spasms. 02/13/21   Shirline Frees, NP    Allergies    Tylenol [acetaminophen]  Review of Systems   Review of Systems  Constitutional:  Negative for chills.  HENT:  Negative for ear pain and sore throat.   Eyes:  Negative for pain and visual disturbance.  Respiratory:  Negative for cough and shortness of breath.  Skin:  Negative for color change and rash.  All other systems reviewed and are negative.  Physical Exam Updated Vital Signs BP (!) 88/55   Pulse (!) 39   Temp 98.2 F (36.8 C)   Resp 18   Ht 5\' 6"  (1.676 m)   Wt 79.4 kg   SpO2 100%   BMI 28.25 kg/m   Physical Exam Vitals and nursing note reviewed.  Constitutional:      Appearance: Normal appearance.  HENT:     Head: Normocephalic and atraumatic.  Eyes:     Conjunctiva/sclera: Conjunctivae normal.  Pulmonary:     Effort: Pulmonary effort is normal. No respiratory distress.  Abdominal:     General: There is no distension.     Tenderness: There is abdominal tenderness. There is no guarding or rebound.     Comments: Mild upper abdominal tenderness  Musculoskeletal:         General: No deformity. Normal range of motion.     Cervical back: Normal range of motion.  Skin:    General: Skin is warm and dry.  Neurological:     General: No focal deficit present.     Mental Status: He is alert and oriented to person, place, and time. Mental status is at baseline.  Psychiatric:        Mood and Affect: Mood normal.    ED Results / Procedures / Treatments   Labs (all labs ordered are listed, but only abnormal results are displayed) Labs Reviewed  URINALYSIS, ROUTINE W REFLEX MICROSCOPIC - Abnormal; Notable for the following components:      Result Value   APPearance HAZY (*)    Specific Gravity, Urine 1.036 (*)    Ketones, ur TRACE (*)    Protein, ur 30 (*)    All other components within normal limits  RESP PANEL BY RT-PCR (FLU A&B, COVID) ARPGX2  GASTROINTESTINAL PANEL BY PCR, STOOL (REPLACES STOOL CULTURE)  LIPASE, BLOOD  COMPREHENSIVE METABOLIC PANEL  CBC    EKG None  Radiology No results found.  Procedures Procedures   Medications Ordered in ED Medications  ondansetron (ZOFRAN) injection 4 mg (has no administration in time range)  sodium chloride 0.9 % bolus 1,000 mL (has no administration in time range)  ondansetron (ZOFRAN-ODT) disintegrating tablet 8 mg (8 mg Oral Given 03/18/21 1314)    ED Course  I have reviewed the triage vital signs and the nursing notes.  Pertinent labs & imaging results that were available during my care of the patient were reviewed by me and considered in my medical decision making (see chart for details).  Clinical Course as of 03/18/21 1544  Tue Mar 18, 2021  1423 He was given a p.o. challenge and was able to sip a little bit of fluid.  However, he stopped drinking because of fear of vomiting. [AW]    Clinical Course User Index [AW] Mar 20, 2021, MD   MDM Rules/Calculators/A&P                           [facility] presented with vomiting and diarrhea.  He had a period of mild hypotension and was  given IV fluids.  He was able to take small sips of fluid during his ED course and had no further episodes of vomiting and diarrhea.  Specifically, he had no further hematemesis.  He was not able to produce a stool sample for testing.  Abdominal imaging was not performed secondary  to his overall benign abdominal exam. Final Clinical Impression(s) / ED Diagnoses Final diagnoses:  Gastroenteritis    Rx / DC Orders ED Discharge Orders          Ordered    ondansetron (ZOFRAN-ODT) 4 MG disintegrating tablet  Every 8 hours PRN        03/18/21 1424             Koleen Distance, MD 03/18/21 1546

## 2021-03-18 NOTE — ED Triage Notes (Signed)
Woke up this morning vomiting and diarrhea since yesterday.  Vomited blood this morning x 1.

## 2021-03-21 ENCOUNTER — Encounter: Payer: Self-pay | Admitting: Adult Health

## 2021-03-21 ENCOUNTER — Other Ambulatory Visit (HOSPITAL_COMMUNITY): Payer: Self-pay

## 2021-03-21 ENCOUNTER — Ambulatory Visit (INDEPENDENT_AMBULATORY_CARE_PROVIDER_SITE_OTHER): Payer: No Typology Code available for payment source | Admitting: Adult Health

## 2021-03-21 ENCOUNTER — Other Ambulatory Visit: Payer: Self-pay

## 2021-03-21 VITALS — BP 110/80 | HR 81 | Temp 98.7°F | Ht 66.0 in | Wt 149.0 lb

## 2021-03-21 DIAGNOSIS — K2100 Gastro-esophageal reflux disease with esophagitis, without bleeding: Secondary | ICD-10-CM | POA: Diagnosis not present

## 2021-03-21 DIAGNOSIS — R1013 Epigastric pain: Secondary | ICD-10-CM

## 2021-03-21 MED ORDER — SUCRALFATE 1 G PO TABS
1.0000 g | ORAL_TABLET | Freq: Three times a day (TID) | ORAL | 0 refills | Status: DC
  Filled 2021-03-21: qty 56, 14d supply, fill #0

## 2021-03-21 MED ORDER — ONDANSETRON 4 MG PO TBDP
4.0000 mg | ORAL_TABLET | Freq: Three times a day (TID) | ORAL | 0 refills | Status: DC | PRN
  Filled 2021-03-21: qty 20, 7d supply, fill #0

## 2021-03-21 MED ORDER — OMEPRAZOLE 40 MG PO CPDR
40.0000 mg | DELAYED_RELEASE_CAPSULE | Freq: Every day | ORAL | 3 refills | Status: DC
Start: 2021-03-21 — End: 2021-07-18
  Filled 2021-03-21: qty 90, 90d supply, fill #0

## 2021-03-21 NOTE — Progress Notes (Signed)
Subjective:    Patient ID: Brett Henry, male    DOB: 12-Feb-1991, 30 y.o.   MRN: [id number]  HPI 30 year old male who  has a past medical history of Asthma and Back pain.  He presents to the office today for follow-up after recently being seen in the emergency room 3 days ago for gastroenteritis.  He had reported multiple episodes of vomiting and diarrhea over the past few days prior to being seen.  He did have a single episode of scant bright hematemesis morning, but there was no hematemesis up to that point.  He did eat undercooked hamburger several days prior and wonders if that was the contributing symptom.  No one else around him was sick  In the ER his exam showed some mild upper abdominal tenderness.  Blood work including lipase, CMP, CBC were within normal limits.  Urinalysis showed dehydration.  He completed a p.o. challenge and was discharged with Zofran  Today he reports today that he is feeling better but continues to have nausea and vomiting intermittently. No blood since he was seen in the ER. He continues to also have epigastric abdominal pain. He continues to take Naprosyn    Review of Systems See HPI   Past Medical History:  Diagnosis Date   Asthma    Back pain     Social History   Socioeconomic History   Marital status: Married    Spouse name: Not on file   Number of children: Not on file   Years of education: Not on file   Highest education level: Not on file  Occupational History   Not on file  Tobacco Use   Smoking status: Former    Packs/day: 1.00    Years: 10.00    Pack years: 10.00    Types: Cigarettes   Smokeless tobacco: Never  Vaping Use   Vaping Use: Never used  Substance and Sexual Activity   Alcohol use: Yes    Comment: occasional   Drug use: No   Sexual activity: Yes  Other Topics Concern   Not on file  Social History Narrative   Not currently working    Social Determinants of Corporate investment banker Strain: Not on file   Food Insecurity: Not on file  Transportation Needs: Not on file  Physical Activity: Not on file  Stress: Not on file  Social Connections: Not on file  Intimate Partner Violence: Not on file    Past Surgical History:  Procedure Laterality Date   EYE SURGERY Left    age 55     Family History  Problem Relation Age of Onset   Asthma Mother    Gout Father    Diabetes Maternal Grandmother     Allergies  Allergen Reactions   Tylenol [Acetaminophen] Anaphylaxis    Current Outpatient Medications on File Prior to Visit  Medication Sig Dispense Refill   albuterol (VENTOLIN HFA) 108 (90 Base) MCG/ACT inhaler Inhale 2 puffs into the lungs every 6 (six) hours as needed for shortness of breath and wheezing. 18 g 0   baclofen (LIORESAL) 10 MG tablet Take 0.5-1 tablets (5-10 mg total) by mouth at bedtime as needed for muscle spasms. 30 each 3   Cholecalciferol (VITAMIN D-3) 125 MCG (5000 UT) TABS Take 1 tablet by mouth daily. 90 tablet 3   naproxen (NAPROSYN) 500 MG tablet Take 1 tablet (500 mg total) by mouth 2 (two) times daily. 30 tablet 0   omeprazole (PRILOSEC) 20 MG  capsule Take 1 capsule (20 mg total) by mouth daily. 90 capsule 2   ondansetron (ZOFRAN-ODT) 4 MG disintegrating tablet Take 1 tablet (4 mg total) by mouth every 8 (eight) hours as needed for nausea or vomiting. 20 tablet 0   tiZANidine (ZANAFLEX) 2 MG tablet Take 1 tablet (2 mg total) by mouth every 6 (six) hours as needed for muscle spasms. 30 tablet 0   traMADol (ULTRAM) 50 MG tablet Take by mouth every 6 (six) hours as needed.     No current facility-administered medications on file prior to visit.    BP 110/80   Pulse 81   Temp 98.7 F (37.1 C) (Oral)   Ht 5\' 6"  (1.676 m)   Wt 149 lb (67.6 kg)   SpO2 98%   BMI 24.05 kg/m       Objective:   Physical Exam Vitals and nursing note reviewed.  Constitutional:      Appearance: Normal appearance.  Cardiovascular:     Rate and Rhythm: Normal rate and regular  rhythm.     Pulses: Normal pulses.     Heart sounds: Normal heart sounds.  Pulmonary:     Effort: Pulmonary effort is normal.     Breath sounds: Normal breath sounds.  Abdominal:     General: Abdomen is flat. Bowel sounds are normal.     Palpations: Abdomen is soft.     Tenderness: There is abdominal tenderness in the epigastric area.  Neurological:     Mental Status: He is alert.      Assessment & Plan:  1. Gastroesophageal reflux disease with esophagitis - Increase Prilosec to 40 mg  - omeprazole (PRILOSEC) 40 MG capsule; Take 1 capsule (40 mg total) by mouth daily.  Dispense: 90 capsule; Refill: 3  2. Epigastric pain - Possible PUD. Will stop Naprosyn. Start Carafate and increase prilosec to 40 mg.  - Advanced diet as tolerated - Follow up if symptoms persist over the next 1-2 weeks or sooner if symptoms worsen - sucralfate (CARAFATE) 1 g tablet; Take 1 tablet (1 g total) by mouth 4 (four) times daily -  with meals and at bedtime for 14 days.  Dispense: 56 tablet; Refill: 0 - omeprazole (PRILOSEC) 40 MG capsule; Take 1 capsule (40 mg total) by mouth daily.  Dispense: 90 capsule; Refill: 3 - ondansetron (ZOFRAN-ODT) 4 MG disintegrating tablet; Take 1 tablet (4 mg total) by mouth every 8 (eight) hours as needed for nausea or vomiting.  Dispense: 20 tablet; Refill: 0   , NP

## 2021-04-08 ENCOUNTER — Other Ambulatory Visit: Payer: Self-pay

## 2021-04-09 ENCOUNTER — Ambulatory Visit: Payer: No Typology Code available for payment source | Admitting: Adult Health

## 2021-04-11 ENCOUNTER — Ambulatory Visit: Payer: No Typology Code available for payment source | Admitting: Adult Health

## 2021-07-18 ENCOUNTER — Encounter: Payer: Self-pay | Admitting: Adult Health

## 2021-07-18 ENCOUNTER — Other Ambulatory Visit (HOSPITAL_COMMUNITY): Payer: Self-pay

## 2021-07-18 ENCOUNTER — Ambulatory Visit (INDEPENDENT_AMBULATORY_CARE_PROVIDER_SITE_OTHER): Payer: No Typology Code available for payment source | Admitting: Adult Health

## 2021-07-18 ENCOUNTER — Telehealth: Payer: Self-pay | Admitting: Adult Health

## 2021-07-18 VITALS — BP 108/80 | HR 80 | Temp 98.0°F | Ht 66.0 in | Wt 147.0 lb

## 2021-07-18 DIAGNOSIS — M7542 Impingement syndrome of left shoulder: Secondary | ICD-10-CM | POA: Diagnosis not present

## 2021-07-18 DIAGNOSIS — J45990 Exercise induced bronchospasm: Secondary | ICD-10-CM

## 2021-07-18 DIAGNOSIS — G8929 Other chronic pain: Secondary | ICD-10-CM

## 2021-07-18 DIAGNOSIS — K920 Hematemesis: Secondary | ICD-10-CM

## 2021-07-18 DIAGNOSIS — M545 Low back pain, unspecified: Secondary | ICD-10-CM | POA: Diagnosis not present

## 2021-07-18 MED ORDER — ONDANSETRON 4 MG PO TBDP
4.0000 mg | ORAL_TABLET | Freq: Three times a day (TID) | ORAL | 0 refills | Status: DC | PRN
  Filled 2021-07-18: qty 20, 7d supply, fill #0

## 2021-07-18 MED ORDER — PANTOPRAZOLE SODIUM 40 MG PO TBEC
40.0000 mg | DELAYED_RELEASE_TABLET | Freq: Every day | ORAL | 0 refills | Status: DC
  Filled 2021-07-18: qty 90, 90d supply, fill #0

## 2021-07-18 MED ORDER — ALBUTEROL SULFATE HFA 108 (90 BASE) MCG/ACT IN AERS
2.0000 | INHALATION_SPRAY | Freq: Four times a day (QID) | RESPIRATORY_TRACT | 0 refills | Status: DC | PRN
  Filled 2021-07-18: qty 18, 25d supply, fill #0

## 2021-07-18 MED ORDER — METHYLPREDNISOLONE ACETATE 80 MG/ML IJ SUSP
80.0000 mg | Freq: Once | INTRAMUSCULAR | Status: AC
  Administered 2021-07-18: 80 mg via INTRA_ARTICULAR

## 2021-07-18 MED ORDER — SUCRALFATE 1 G PO TABS
1.0000 g | ORAL_TABLET | Freq: Three times a day (TID) | ORAL | 0 refills | Status: DC
  Filled 2021-07-18: qty 120, 30d supply, fill #0

## 2021-07-18 MED ORDER — TIZANIDINE HCL 2 MG PO TABS
2.0000 mg | ORAL_TABLET | Freq: Four times a day (QID) | ORAL | 0 refills | Status: DC | PRN
  Filled 2021-07-18: qty 30, 8d supply, fill #0

## 2021-07-18 NOTE — Telephone Encounter (Signed)
Please advise 

## 2021-07-18 NOTE — Telephone Encounter (Signed)
Please see Mychart message.

## 2021-07-18 NOTE — Progress Notes (Signed)
Subjective:    Patient ID: Brett Henry, male    DOB: 1991/05/20, 30 y.o.   MRN: [id number]  HPI 30 year old male who  has a past medical history of Asthma and Back pain. He presents to the office today for multiple issues.  Has a history of hematemesis and possible history of gastric ulcer.  Reports over the last week he has had an episode, 2 days ago of throwing up scant red blood.  He has been taking his Prilosec and refraining from using anti-inflammatories.  Has not had any episodes since.  Denies diarrhea but has had epigastric abdominal pain.  He does not smoke cigarettes but will have a black and mild cigar no more than 1 a day.  Additionally, over the last month he has had left shoulder pain with loss of range of motion.  Denies trauma or aggravating injury.  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  He denies any loss of grip strength  Also, he has chronic low back pain.  He has been seen by Dr. Prince Rome in the past with recommendation of trying facet blocks but the patient wanted to try chiropractor first.  Also been on tramadol which really did not help alleviate the pain.  The only thing that he is found to help has been a muscle relaxer.  Needs a refill on his albuterol inhaler    Review of Systems See HPI   Past Medical History:  Diagnosis Date   Asthma    Back pain     Social History   Socioeconomic History   Marital status: Married    Spouse name: Not on file   Number of children: Not on file   Years of education: Not on file   Highest education level: Not on file  Occupational History   Not on file  Tobacco Use   Smoking status: Former    Packs/day: 1.00    Years: 10.00    Pack years: 10.00    Types: Cigarettes   Smokeless tobacco: Never  Vaping Use   Vaping Use: Never used  Substance and Sexual Activity   Alcohol use: Yes    Comment: occasional   Drug use: No   Sexual activity: Yes  Other Topics Concern   Not on file  Social History  Narrative   Not currently working    Social Determinants of Corporate investment banker Strain: Not on file  Food Insecurity: Not on file  Transportation Needs: Not on file  Physical Activity: Not on file  Stress: Not on file  Social Connections: Not on file  Intimate Partner Violence: Not on file    Past Surgical History:  Procedure Laterality Date   EYE SURGERY Left    age 27     Family History  Problem Relation Age of Onset   Asthma Mother    Gout Father    Diabetes Maternal Grandmother     Allergies  Allergen Reactions   Tylenol [Acetaminophen] Anaphylaxis    Current Outpatient Medications on File Prior to Visit  Medication Sig Dispense Refill   Cholecalciferol (VITAMIN D-3) 125 MCG (5000 UT) TABS Take 1 tablet by mouth daily. 90 tablet 3   No current facility-administered medications on file prior to visit.    BP 108/80   Pulse 80   Temp 98 F (36.7 C) (Oral)   Ht 5\' 6"  (1.676 m)   Wt 147 lb (66.7 kg)   SpO2 98%   BMI  23.73 kg/m       Objective:   Physical Exam Vitals and nursing note reviewed.  Constitutional:      Appearance: Normal appearance.  Cardiovascular:     Rate and Rhythm: Normal rate and regular rhythm.     Pulses: Normal pulses.     Heart sounds: Normal heart sounds.  Pulmonary:     Breath sounds: Normal breath sounds.  Abdominal:     General: Abdomen is flat. Bowel sounds are normal.     Palpations: Abdomen is soft. There is no mass.     Tenderness: There is abdominal tenderness in the epigastric area. There is guarding and rebound.  Musculoskeletal:     Left shoulder: Bony tenderness present. No crepitus. Decreased range of motion. Normal strength. Normal pulse.     Comments: Is able to bring his hand over his head, perform Apley back scratch test, cannot open test and have good strength against resistance but has pain with doing so.  Skin:    General: Skin is warm and dry.  Neurological:     General: No focal deficit present.      Mental Status: He is alert and oriented to person, place, and time.  Psychiatric:        Mood and Affect: Mood normal.        Behavior: Behavior normal.        Thought Content: Thought content normal.        Judgment: Judgment normal.      Assessment & Plan:   1. Hematemesis with nausea - Switch from Prilosec to Protonix.  - Advised against NSAIDs - Will send to GI - pantoprazole (PROTONIX) 40 MG tablet; Take 1 tablet (40 mg total) by mouth daily.  Dispense: 90 tablet; Refill: 0 - sucralfate (CARAFATE) 1 g tablet; Take 1 tablet (1 g total) by mouth 4 (four) times daily -  with meals and at bedtime.  Dispense: 120 tablet; Refill: 0 - ondansetron (ZOFRAN-ODT) 4 MG disintegrating tablet; Take 1 tablet (4 mg total) by mouth every 8 (eight) hours as needed for nausea or vomiting.  Dispense: 20 tablet; Refill: 0 - Ambulatory referral to Gastroenterology  2. Rotator cuff impingement syndrome of left shoulder - Discussed options for treatment including PT and shoulder injection. He opted for shoulder injection  - Shoulder injection Verbal consent obtained and verified. Sterile betadine prep. Furthur cleansed with alcohol. Topical analgesic spray: Ethyl chloride. Joint: left subacromial injection Approached in typical fashion with: posterior approach Completed without difficulty Meds: 3 cc lidocaine 2% no epi, 1 cc depomedrol 80mg /cc Needle:1.5 inch 25 gauge Aftercare instructions and Red flags advised. Immediate improvement in pain noted  - methylPREDNISolone acetate (DEPO-MEDROL) injection 80 mg  3. Chronic bilateral low back pain without sciatica - Advised to follow up with Dr. - tiZANidine (ZANAFLEX) 2 MG tablet; Take 1 tablet (2 mg total) by mouth every 6 (six) hours as needed for muscle spasms.  Dispense: 30 tablet; Refill: 0  4. Exercise-induced asthma  - albuterol (VENTOLIN HFA) 108 (90 Base) MCG/ACT inhaler; Inhale 2 puffs into the lungs every 6 (six) hours as  needed for shortness of breath and wheezing.  Dispense: 18 g; Refill: 0   Prince Rome, NP

## 2021-07-18 NOTE — Patient Instructions (Signed)
I am going to switch you from Prilosec to Protonix   I am going to send you to Gastroenterology   I have refilled all your medications

## 2021-07-18 NOTE — Telephone Encounter (Signed)
Pt has sent mychart message and pt was seen  for left shoulder pain and pt is calling because  3 hrs after the injection his shoulder is still hurting and pt would also like work note for today put in his mychart. Please advise

## 2021-07-21 ENCOUNTER — Other Ambulatory Visit (HOSPITAL_COMMUNITY): Payer: Self-pay

## 2021-07-22 ENCOUNTER — Encounter: Payer: Self-pay | Admitting: Gastroenterology

## 2021-08-20 ENCOUNTER — Ambulatory Visit (INDEPENDENT_AMBULATORY_CARE_PROVIDER_SITE_OTHER): Payer: No Typology Code available for payment source | Admitting: Gastroenterology

## 2021-08-20 ENCOUNTER — Encounter: Payer: Self-pay | Admitting: Gastroenterology

## 2021-08-20 ENCOUNTER — Other Ambulatory Visit (INDEPENDENT_AMBULATORY_CARE_PROVIDER_SITE_OTHER): Payer: No Typology Code available for payment source

## 2021-08-20 VITALS — BP 110/50 | HR 68 | Ht 65.0 in | Wt 142.4 lb

## 2021-08-20 DIAGNOSIS — K920 Hematemesis: Secondary | ICD-10-CM | POA: Diagnosis not present

## 2021-08-20 DIAGNOSIS — R1013 Epigastric pain: Secondary | ICD-10-CM

## 2021-08-20 DIAGNOSIS — R109 Unspecified abdominal pain: Secondary | ICD-10-CM

## 2021-08-20 LAB — CBC
HCT: 41.9 % (ref 39.0–52.0)
Hemoglobin: 14.1 g/dL (ref 13.0–17.0)
MCHC: 33.6 g/dL (ref 30.0–36.0)
MCV: 90.9 fl (ref 78.0–100.0)
Platelets: 175 10*3/uL (ref 150.0–400.0)
RBC: 4.61 Mil/uL (ref 4.22–5.81)
RDW: 13.3 % (ref 11.5–15.5)
WBC: 7.5 10*3/uL (ref 4.0–10.5)

## 2021-08-20 LAB — COMPREHENSIVE METABOLIC PANEL
ALT: 16 U/L (ref 0–53)
AST: 18 U/L (ref 0–37)
Albumin: 4.1 g/dL (ref 3.5–5.2)
Alkaline Phosphatase: 68 U/L (ref 39–117)
BUN: 17 mg/dL (ref 6–23)
CO2: 26 mEq/L (ref 19–32)
Calcium: 9.2 mg/dL (ref 8.4–10.5)
Chloride: 107 mEq/L (ref 96–112)
Creatinine, Ser: 1.01 mg/dL (ref 0.40–1.50)
GFR: 98.89 mL/min (ref >60.00)
Glucose, Bld: 85 mg/dL (ref 70–99)
Potassium: 3.4 mEq/L — ABNORMAL LOW (ref 3.5–5.1)
Sodium: 139 mEq/L (ref 135–145)
Total Bilirubin: 0.5 mg/dL (ref 0.2–1.2)
Total Protein: 7.1 g/dL (ref 6.0–8.3)

## 2021-08-20 NOTE — Patient Instructions (Addendum)
If you are age 32 or younger, your body mass index should be between 19-25. Your Body mass index is 23.69 kg/m. If this is out of the aformentioned range listed, please consider follow up with your Primary Care Provider.  ______________________________________________________  The [place] [facility] providers would like to encourage you to use [facility] to communicate with providers for non-urgent requests or questions.  Due to long hold times on the telephone, sending your provider a message by [facility] may be a faster and more efficient way to get a response.  Please allow 48 business hours for a response.  Please remember that this is for non-urgent requests.  _______________________________________________________  Your provider has requested that you go to the basement level for lab work before leaving today. Press "B" on the elevator. The lab is located at the first door on the left as you exit the elevator.  You have been scheduled for a CT scan of the abdomen and pelvis at [facility], 1st floor Radiology. You are scheduled on 09/05/21  at 1030am You should arrive 67mnutes prior to your appointment time for registration.  [place] your scan. The solution may taste better if refrigerated, but do NOT add ice or any other liquid to this solution. Shake well before drinking.   Please follow the written instructions below on the day of your exam:   1) Do not eat anything after 630am(4 hours prior to your test)   2) Drink 1 bottle of contrast @ 830am(2 hours prior to your exam)  Remember to shake well before drinking and do NOT pour over ice.     Drink 1 bottle of contrast @ 930am (1 hour prior to your exam)   You may take any medications as prescribed with a small amount of water, if necessary. If you take any of the following medications: METFORMIN, GLUCOPHAGE, GLUCOVANCE, AVANDAMET, RIOMET, FORTAMET, ANew MindenMET, JANUMET,  GLUMETZA or METAGLIP, you MAY be asked to HOLD this medication 48 hours AFTER the exam.   The purpose of you drinking the oral contrast is to aid in the visualization of your intestinal tract. The contrast solution may cause some diarrhea. Depending on your individual set of symptoms, you may also receive an intravenous injection of x-ray contrast/dye. [facility], depending on the type of exam you are having performed.   If you have any questions regarding your exam or if you need to reschedule, you may call [facility] at [phone] the hours of 8:00 am and 5:00 pm, Monday-Friday.   You have been scheduled for an endoscopy. Please follow written instructions given to you at your visit today. If you use inhalers (even only as needed), please bring them with you on the day of your procedure.  Due to recent changes in healthcare laws, you may see the results of your imaging and laboratory studies on MyChart before your provider has had a chance to review them.  We understand that in some cases there may be results that are confusing or concerning to you. Not all laboratory results come back in the same time frame and the provider may be waiting for multiple results in order to interpret others.  Please give [id number] hours in order for your provider to thoroughly review all the results before contacting the office for clarification of your results.   You can continue  taking both omeprazole and pantoprazole.  Take one shortly before breakfast and one shortly before dinner meal each day.  Thank you for entrusting me with your care and choosing [facility].  Dr Ardis Hughs
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
## 2021-09-05 ENCOUNTER — Other Ambulatory Visit: Payer: Self-pay

## 2021-09-05 ENCOUNTER — Ambulatory Visit (HOSPITAL_COMMUNITY)
Admission: RE | Admit: 2021-09-05 | Discharge: 2021-09-05 | Disposition: A | Discharge status: Home / Self Care | Payer: BC Managed Care – PPO | Source: Ambulatory Visit | Attending: Gastroenterology | Admitting: Gastroenterology

## 2021-09-05 ENCOUNTER — Encounter (HOSPITAL_COMMUNITY): Payer: Self-pay

## 2021-09-05 DIAGNOSIS — R1013 Epigastric pain: Secondary | ICD-10-CM | POA: Diagnosis not present

## 2021-09-05 DIAGNOSIS — R109 Unspecified abdominal pain: Secondary | ICD-10-CM | POA: Insufficient documentation

## 2021-09-05 IMAGING — CT CT ABD-PELV W/ CM
2 of 4 series · 15 of 46 positions shown, 17 images · IV contrast (OMNIPAQUE)
Comparison: MRI lumbar spine [DATE]

CLINICAL DATA: Right upper quadrant and epigastric abdominal pain.

EXAM:
CT ABDOMEN AND PELVIS WITH CONTRAST
TECHNIQUE: Multidetector CT imaging of the abdomen and pelvis was performed
using the standard protocol following bolus administration of
intravenous contrast.

[Series 2: axial st · axial · 0.64mm/px · z∈[-664,-279]mm · 12 of 87 slices shown, 14 images]
[im 5/87  soft-tissue]
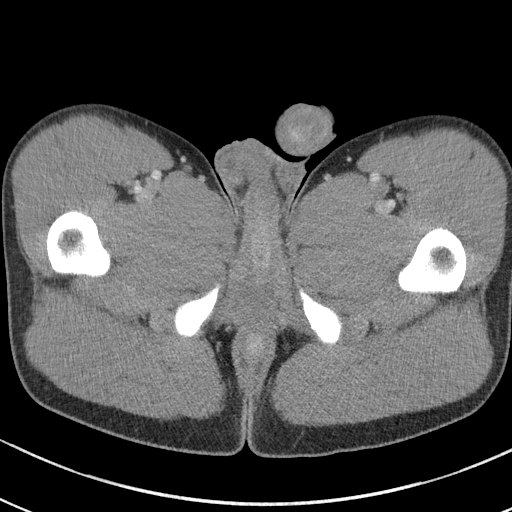
[im 5/87  bone]
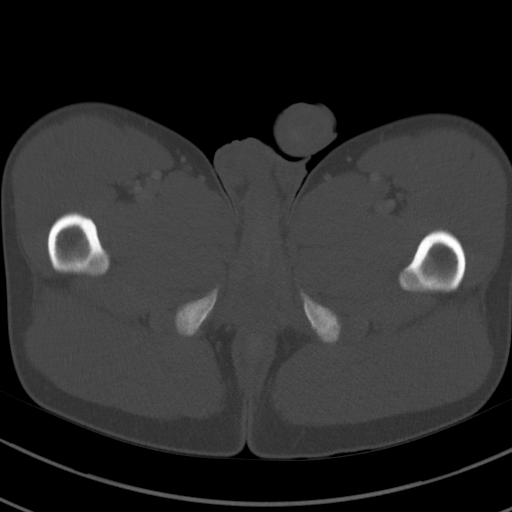
[im 14/87  soft-tissue]
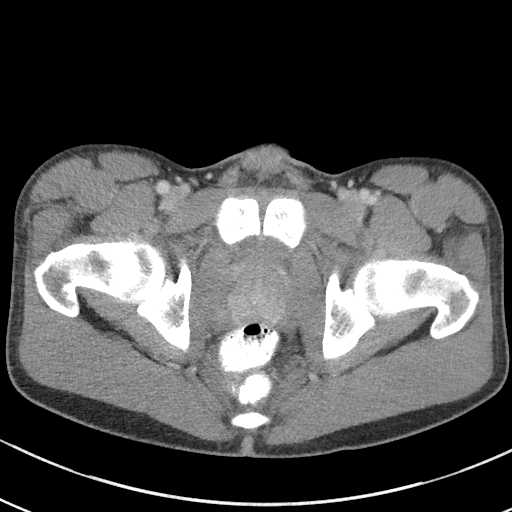
[im 19/87  soft-tissue]
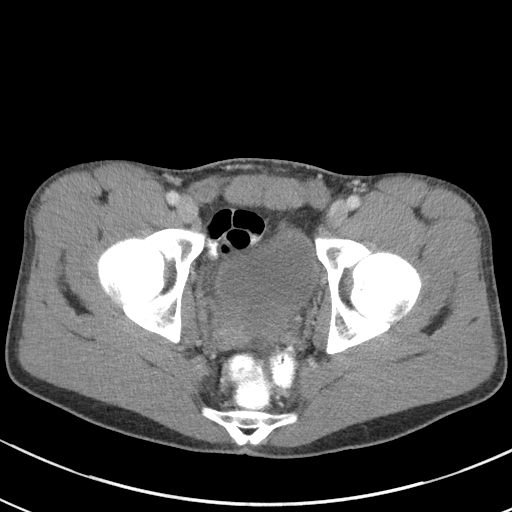
[im 28/87  soft-tissue]
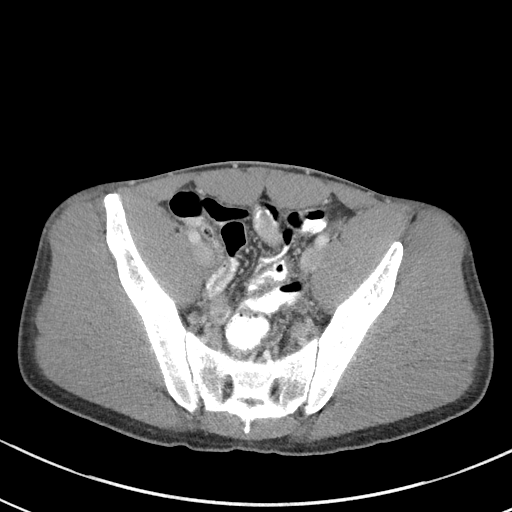
[im 32/87  soft-tissue]
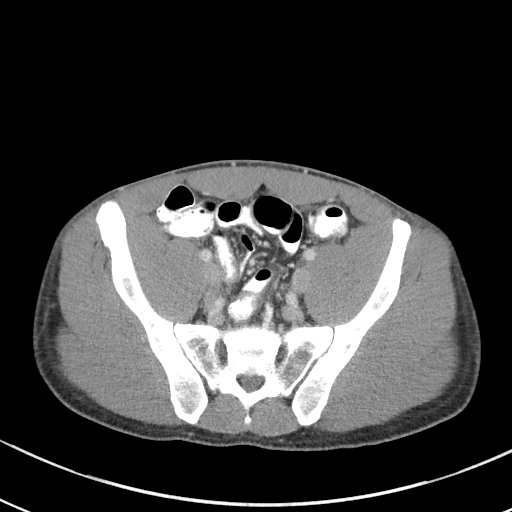
[im 41/87  soft-tissue]
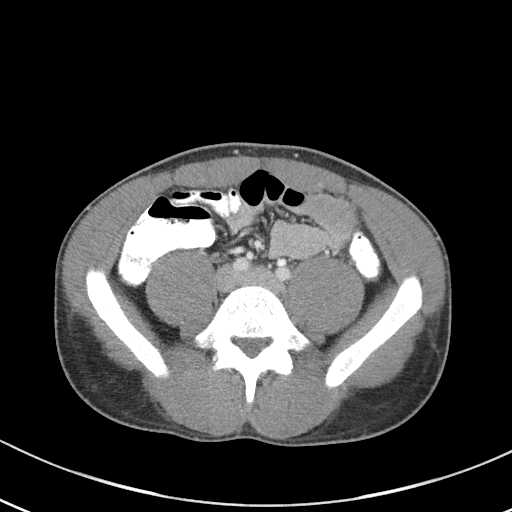
[im 46/87  soft-tissue]
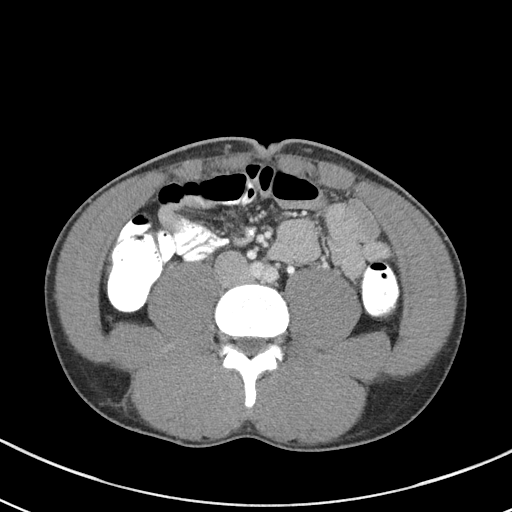
[im 55/87  soft-tissue]
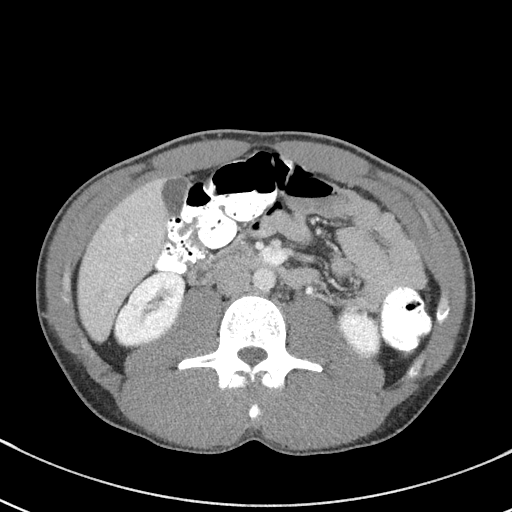
[im 59/87  soft-tissue]
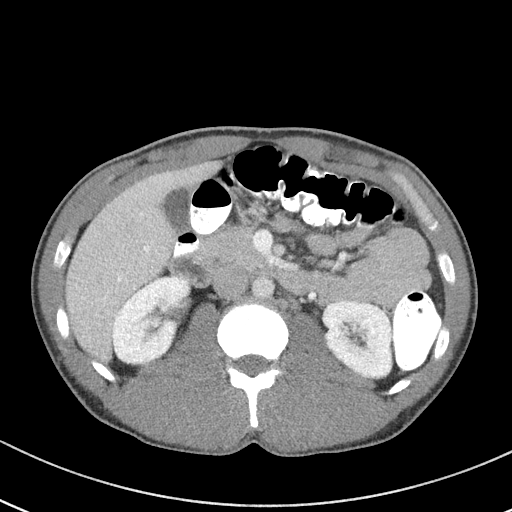
[im 59/87  bone]
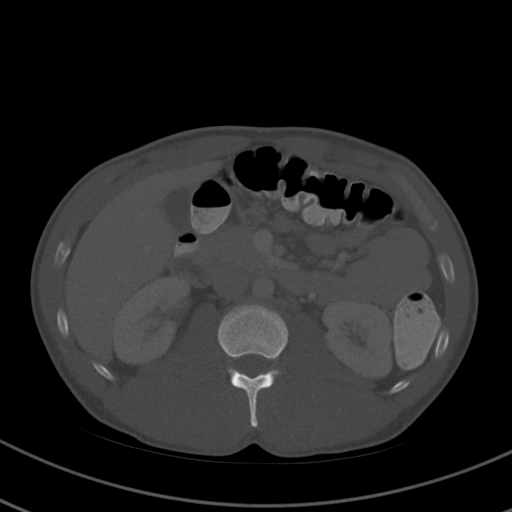
[im 68/87  soft-tissue]
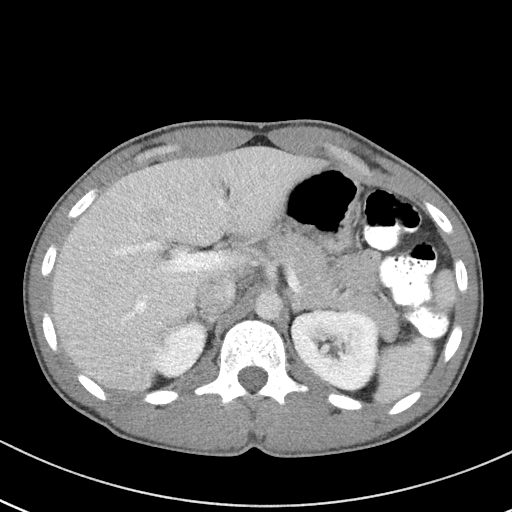
[im 73/87  soft-tissue]
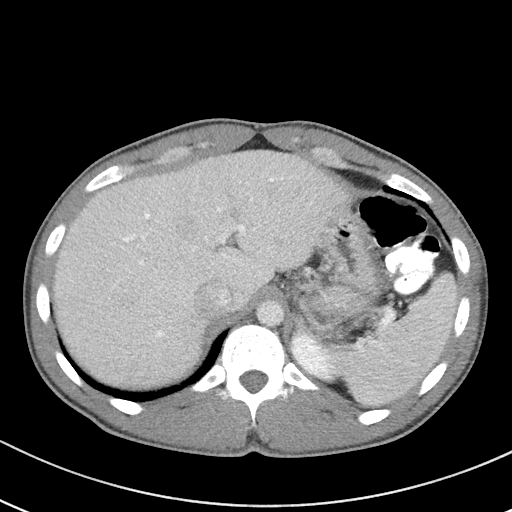
[im 82/87  soft-tissue]
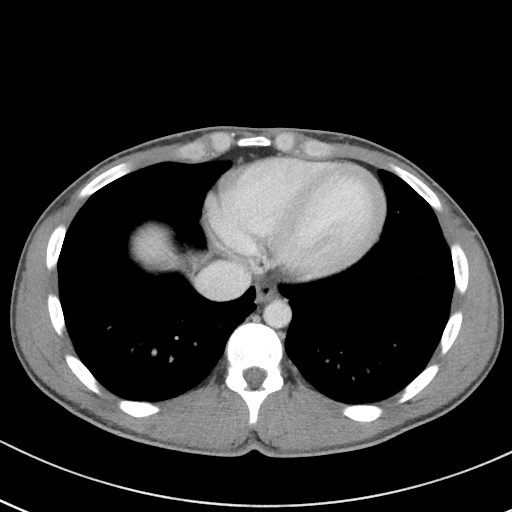

[Series 5: coronal st · coronal · 0.63mm/px · 3 of 73 slices shown]
[im 25/73  soft-tissue]
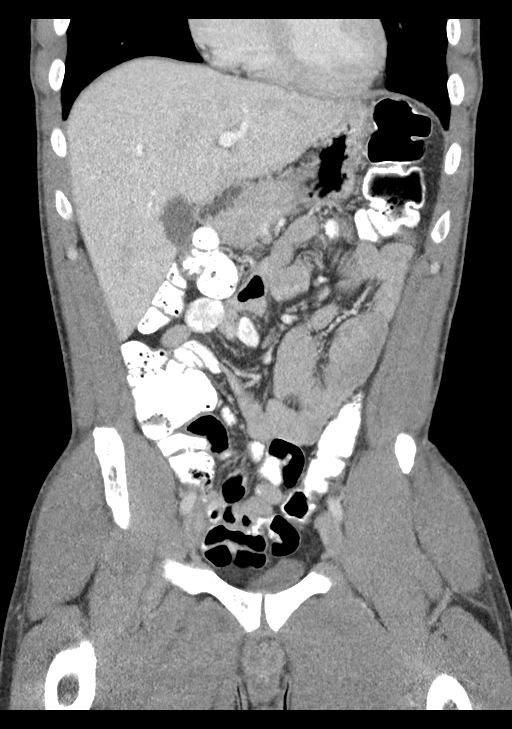
[im 33/73  soft-tissue]
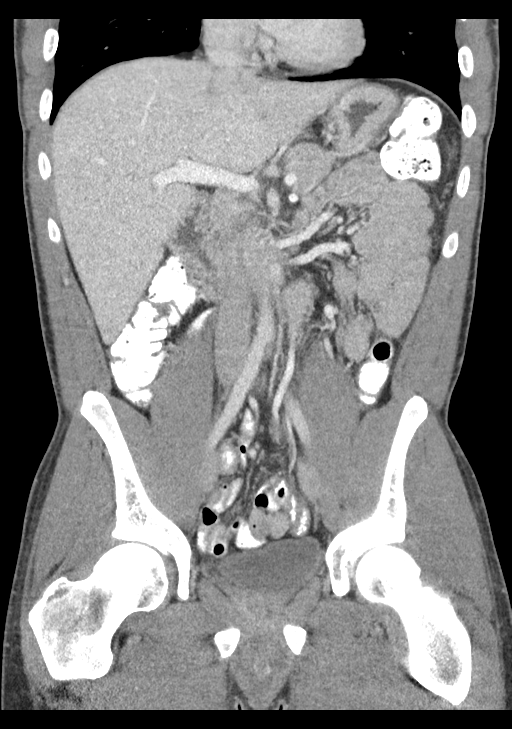
[im 41/73  soft-tissue]
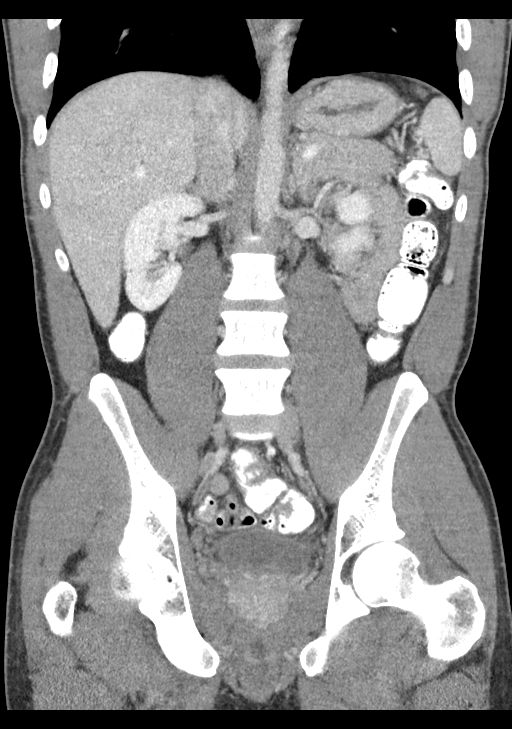

[15 of 46 positions shown; findings below may reference images not displayed]

RADIATION DOSE REDUCTION: This exam was performed according to the
departmental dose-optimization program which includes automated
exposure control, adjustment of the mA and/or kV according to
patient size and/or use of iterative reconstruction technique.

CONTRAST:  100mL OMNIPAQUE IOHEXOL 300 MG/ML  SOLN
FINDINGS: Lower chest: No acute abnormality.

Hepatobiliary: No suspicious hepatic lesion. Gallbladder is
unremarkable. No biliary ductal dilation.

Pancreas: Hypodense 5 mm lesion in the pancreatic head on image
[DATE]. No pancreatic ductal dilation or evidence of acute
inflammation.

Spleen: Normal in size without focal abnormality.

Adrenals/Urinary Tract: Bilateral adrenal glands appear normal. No
hydronephrosis. Symmetric enhancement of bilateral kidneys. Tiny
hypodense bilateral renal lesions are technically too small to
accurately characterize but statistically likely reflect cysts.
Urinary bladder is unremarkable for degree of distension.

Stomach/Bowel: Radiopaque enteric contrast material traverses the
rectum. Stomach is unremarkable for degree of distension. No
pathologic dilation of small or large bowel. Terminal ileum appears
normal. The appendix is not confidently identified however there is
no pericecal inflammation. No evidence of acute bowel inflammation.

Vascular/Lymphatic: No abdominal aortic aneurysm. No pathologically
enlarged abdominal or pelvic lymph nodes.

Reproductive: Prostate is unremarkable.

Other: No abdominal wall hernia or abnormality. No abdominopelvic
ascites.

Musculoskeletal: No acute or significant osseous findings.
IMPRESSION: 1. No acute abdominopelvic findings.
2. Hypodense 5 mm lesion in the pancreatic head, without pancreatic
ductal dilation, likely reflects a cyst/pseudocyst or side branch
intraductal papillary mucinous neoplasm. Further evaluation with
nonemergent MRCP with and without contrast is recommended.

## 2021-09-05 MED ORDER — SODIUM CHLORIDE (PF) 0.9 % IJ SOLN
INTRAMUSCULAR | Status: AC
  Filled 2021-09-05: qty 50

## 2021-09-05 MED ORDER — IOHEXOL 300 MG/ML  SOLN
100.0000 mL | Freq: Once | INTRAMUSCULAR | Status: AC | PRN
  Administered 2021-09-05: 100 mL via INTRAVENOUS

## 2021-09-08 ENCOUNTER — Other Ambulatory Visit (HOSPITAL_COMMUNITY): Payer: Self-pay

## 2021-09-09 ENCOUNTER — Other Ambulatory Visit: Payer: Self-pay

## 2021-09-09 ENCOUNTER — Encounter: Payer: Self-pay | Admitting: Gastroenterology

## 2021-09-09 ENCOUNTER — Ambulatory Visit (AMBULATORY_SURGERY_CENTER): Payer: No Typology Code available for payment source | Admitting: Gastroenterology

## 2021-09-09 VITALS — BP 100/57 | HR 59 | Temp 98.6°F | Resp 10 | Ht 65.0 in | Wt 142.0 lb

## 2021-09-09 DIAGNOSIS — R110 Nausea: Secondary | ICD-10-CM

## 2021-09-09 DIAGNOSIS — K2970 Gastritis, unspecified, without bleeding: Secondary | ICD-10-CM | POA: Diagnosis not present

## 2021-09-09 DIAGNOSIS — R1084 Generalized abdominal pain: Secondary | ICD-10-CM

## 2021-09-09 DIAGNOSIS — K920 Hematemesis: Secondary | ICD-10-CM | POA: Diagnosis not present

## 2021-09-09 MED ORDER — SODIUM CHLORIDE 0.9 % IV SOLN: 500.0000 mL | Freq: Once | INTRAVENOUS | Status: DC

## 2021-09-09 NOTE — Op Note (Addendum)
[facility] Patient Name: Brett Henry Procedure Date: 09/09/2021 1:58 PM MRN: [id number] Endoscopist: Rachael Feeaniel P Talin Rozeboom , MD Age: [age] Referring MD:  Date of Birth: [birth year] Gender: Male Account #: [id number] Procedure:                Upper GI endoscopy Indications:              Abdominal pain, nausea, limited hematemesis, normal                            Hb Medicines:                Monitored Anesthesia Care Procedure:                Pre-Anesthesia Assessment:                           - Prior to the procedure, a History and Physical                            was performed, and patient medications and                            allergies were reviewed. The patient's tolerance of                            previous anesthesia was also reviewed. The risks                            and benefits of the procedure and the sedation                            options and risks were discussed with the patient.                            All questions were answered, and informed consent                            was obtained. Prior Anticoagulants: The patient has                            taken no previous anticoagulant or antiplatelet                            agents. ASA Grade Assessment: II - A patient with                            mild systemic disease. After reviewing the risks                            and benefits, the patient was deemed in                            satisfactory condition to undergo the procedure.  After obtaining informed consent, the endoscope was                            passed under direct vision. Throughout the                            procedure, the patient's blood pressure, pulse, and                            oxygen saturations were monitored continuously. The                            GIF [id number] #[id number] was introduced through the                            mouth, and advanced to the second part of duodenum.                             The upper GI endoscopy was accomplished without                            difficulty. The patient tolerated the procedure                            well. Scope In: Scope Out: Findings:                 Mild inflammation characterized by erythema,                            friability and granularity was found in the gastric                            antrum. Biopsies were taken with a cold forceps for                            histology.                           The exam was otherwise without abnormality. Complications:            No immediate complications. Estimated blood loss:                            None. Estimated Blood Loss:     Estimated blood loss: none. Impression:               - Mild, non-specific gastritis. Biopsied to check                            for H. pylori.                           - The examination was otherwise normal. Recommendation:           - Patient has a contact number available for  emergencies. The signs and symptoms of potential                            delayed complications were discussed with the                            patient. Return to normal activities tomorrow.                            Written discharge instructions were provided to the                            patient.                           - Resume previous diet.                           - Continue present medications.                           - Await pathology results.                           - Dr. Christella Hartigan' office to call you to arrange MRI abd                            with MRCP to follow up the pancreatic cyst noted on                            your recent CT scan. Rachael Fee, MD 09/09/2021 2:11:13 PM This report has been signed electronically.

## 2021-09-09 NOTE — Progress Notes (Signed)
Called to room to assist during endoscopic procedure.  Patient ID and intended procedure confirmed with present staff. Received instructions for my participation in the procedure from the performing physician.  

## 2021-09-09 NOTE — Patient Instructions (Signed)
YOU HAD AN ENDOSCOPIC PROCEDURE TODAY AT THE [facility]:   Refer to the procedure report that was given to you for any specific questions about what was found during the examination.  If the procedure report does not answer your questions, please call your gastroenterologist to clarify.  If you requested that your care partner not be given the details of your procedure findings, then the procedure report has been included in a sealed envelope for you to review at your convenience later.  YOU SHOULD EXPECT: Some feelings of bloating in the abdomen. Passage of more gas than usual.  Walking can help get rid of the air that was put into your GI tract during the procedure and reduce the bloating. If you had a lower endoscopy (such as a colonoscopy or flexible sigmoidoscopy) you may notice spotting of blood in your stool or on the toilet paper. If you underwent a bowel prep for your procedure, you may not have a normal bowel movement for a few days.  Please Note:  You might notice some irritation and congestion in your nose or some drainage.  This is from the oxygen used during your procedure.  There is no need for concern and it should clear up in a day or so.  SYMPTOMS TO REPORT IMMEDIATELY:    Following upper endoscopy (EGD)  Vomiting of blood or coffee ground material  New chest pain or pain under the shoulder blades  Painful or persistently difficult swallowing  New shortness of breath  Fever of 100F or higher  Black, tarry-looking stools  For urgent or emergent issues, a gastroenterologist can be reached at any hour by calling [phone]. Do not use MyChart messaging for urgent concerns.    DIET:  We do recommend a small meal at first, but then you may proceed to your regular diet.  Drink plenty of fluids but you should avoid alcoholic beverages for 24 hours.  ACTIVITY:  You should plan to take it easy for the rest of today and you should NOT DRIVE or use heavy machinery  until tomorrow (because of the sedation medicines used during the test).    FOLLOW UP: Our staff will call the number listed on your records 48-72 hours following your procedure to check on you and address any questions or concerns that you may have regarding the information given to you following your procedure. If we do not reach you, we will leave a message.  We will attempt to reach you two times.  During this call, we will ask if you have developed any symptoms of COVID 19. If you develop any symptoms (ie: fever, flu-like symptoms, shortness of breath, cough etc.) before then, please call [phone].  If you test positive for Covid 19 in the 2 weeks post procedure, please call and report this information to us.    If any biopsies were taken you will be contacted by phone or by letter within the next 1-3 weeks.  Please call us at [phone] if you have not heard about the biopsies in 3 weeks.    SIGNATURES/CONFIDENTIALITY: You and/or your care partner have signed paperwork which will be entered into your electronic medical record.  These signatures attest to the fact that that the information above on your After Visit Summary has been reviewed and is understood.  Full responsibility of the confidentiality of this discharge information lies with you and/or your care-partner. 

## 2021-09-09 NOTE — Progress Notes (Signed)
Report to PACU, RN, vss, BBS= Clear.  

## 2021-09-09 NOTE — Progress Notes (Signed)
Pt's states no medical or surgical changes since previsit or office visit.  ° °VS DT °

## 2021-09-10 ENCOUNTER — Other Ambulatory Visit: Payer: Self-pay

## 2021-09-10 DIAGNOSIS — K862 Cyst of pancreas: Secondary | ICD-10-CM

## 2021-09-11 ENCOUNTER — Other Ambulatory Visit: Payer: Self-pay

## 2021-09-11 ENCOUNTER — Other Ambulatory Visit (HOSPITAL_COMMUNITY): Payer: Self-pay

## 2021-09-11 ENCOUNTER — Telehealth: Payer: Self-pay

## 2021-09-11 DIAGNOSIS — R109 Unspecified abdominal pain: Secondary | ICD-10-CM

## 2021-09-11 NOTE — Telephone Encounter (Signed)
HIDA scan has been ordered and sent to the schedulers.  I have made pt aware by phone as well as My Chart.

## 2021-09-11 NOTE — Telephone Encounter (Signed)
°  Follow up Call-  Call back number 09/09/2021  Post procedure Call Back phone  # [phone]  Permission to leave phone message Yes  Some recent data might be hidden     Patient questions:  Do you have a fever, pain , or abdominal swelling? yes Pain Score  8  Have you tolerated food without any problems? Yes.   Except once vomited yesterday and pain after he eats  Have you been able to return to your normal activities? Yes.    Do you have any questions about your discharge instructions: Diet   No. Medications  No. Follow up visit  No.  Do you have questions or concerns about your Care? No.  Actions: * If pain score is 4 or above Informed Dr Ardis Hughs and Chong Sicilian.  Have you developed a fever since your procedure? no  2.   Have you had an respiratory symptoms (SOB or cough) since your procedure? no  3.   Have you tested positive for COVID 19 since your procedure no  4.   Have you had any family members/close contacts diagnosed with the COVID 19 since your procedure?  no   If yes to any of these questions please route to Joylene John, RN and Joella Prince, RN

## 2021-09-16 ENCOUNTER — Other Ambulatory Visit (HOSPITAL_COMMUNITY): Payer: Self-pay

## 2021-09-19 ENCOUNTER — Other Ambulatory Visit (HOSPITAL_COMMUNITY): Payer: BC Managed Care – PPO

## 2021-09-20 ENCOUNTER — Ambulatory Visit (HOSPITAL_COMMUNITY)
Admission: RE | Admit: 2021-09-20 | Discharge: 2021-09-20 | Disposition: A | Discharge status: Home / Self Care | Payer: BC Managed Care – PPO | Source: Ambulatory Visit | Attending: Gastroenterology | Admitting: Gastroenterology

## 2021-09-20 ENCOUNTER — Other Ambulatory Visit: Payer: Self-pay

## 2021-09-20 DIAGNOSIS — R935 Abnormal findings on diagnostic imaging of other abdominal regions, including retroperitoneum: Secondary | ICD-10-CM | POA: Diagnosis not present

## 2021-09-20 DIAGNOSIS — N281 Cyst of kidney, acquired: Secondary | ICD-10-CM | POA: Diagnosis not present

## 2021-09-20 DIAGNOSIS — K862 Cyst of pancreas: Secondary | ICD-10-CM | POA: Insufficient documentation

## 2021-09-20 IMAGING — MR MR ABDOMEN WO/W CM MRCP
19 of 23 series · 43 of 48 positions shown · IV contrast (gadavist)
Comparison: CT [DATE]

CLINICAL DATA: Further evaluation of pancreatic cystic lesion.

EXAM:
MRI ABDOMEN WITHOUT AND WITH CONTRAST (INCLUDING MRCP)
TECHNIQUE: Multiplanar multisequence MR imaging of the abdomen was performed
both before and after the administration of intravenous contrast.
Heavily T2-weighted images of the biliary and pancreatic ducts were
obtained, and three-dimensional MRCP images were rendered by post
processing.
CONTRAST:  6.4mL GADAVIST GADOBUTROL 1 MMOL/ML IV SOLN

[Series 6: ax haste · axial · 6.0mm · 1.19mm/px · 1 of 35 slices shown]
[im 1/35]
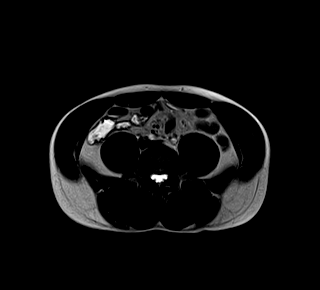

[Series 7: cor haste · coronal · 6.0mm · 1.25mm/px · 1 of 28 slices shown]
[im 1/28]
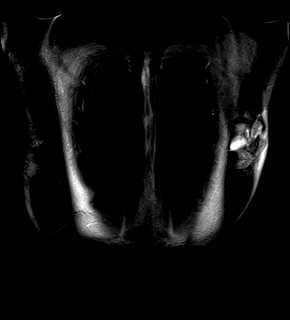

[Series 10: T2 fat-sat · axial · 6.0mm · 1.19mm/px · 1 of 34 slices shown]
[im 1/34]
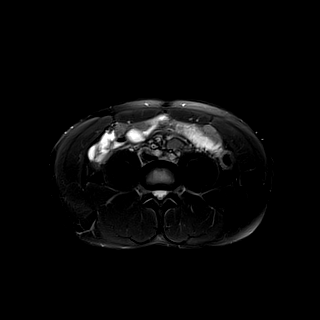

[Series 11: DWI · axial · 6.0mm · 1.42mm/px · z∈[-233,+4]mm · 3 of 102 slices shown (1 of 2)]
[im 1/102]
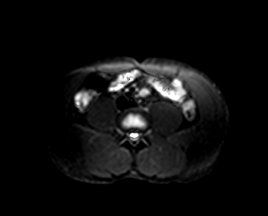
[im 51/102]
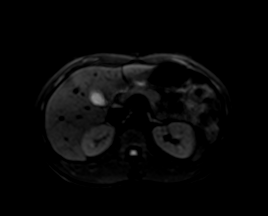
[im 102/102]
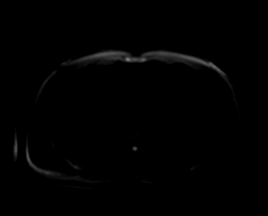

[Series 12: DWI · axial · 6.0mm · 1.42mm/px · 1 of 34 slices shown (2 of 2)]
[im 1/34]
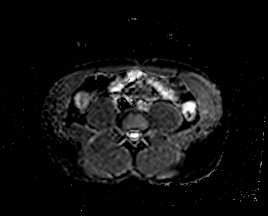

[Series 13: ax in and · axial · 3.0mm · 1.19mm/px · z∈[-233,+4]mm · 2 of 80 slices shown (1 of 2)]
[im 1/80]
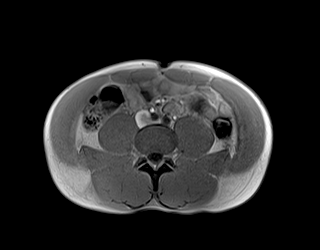
[im 80/80]
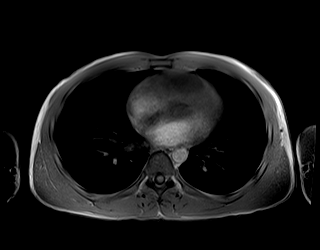

[Series 13: ax in and · axial · 3.0mm · 1.19mm/px · z∈[-233,+4]mm · 3 of 80 slices shown (2 of 2)]
[im 1/80]
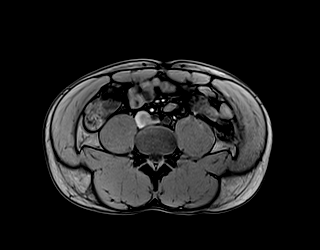
[im 40/80]
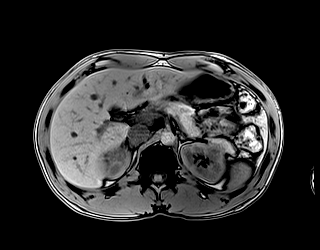
[im 80/80]
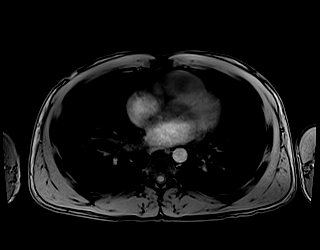

[Series 18: MRCP · coronal · 4.0mm · 1.12mm/px · 1 of 18 slices shown]
[im 1/18]
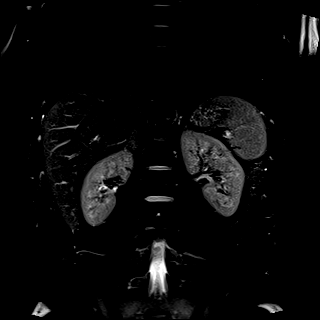

[Series 19: radials · coronal · 50.0mm · 0.78mm/px · 1 of 5 slices shown]
[im 1/5]
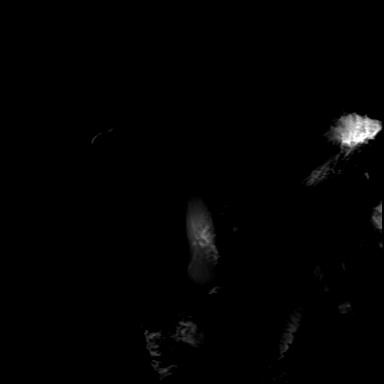

[Series 20: T1 dynamic · axial · non-contrast · 3.0mm · 1.19mm/px · z∈[-233,+4]mm · 3 of 80 slices shown (1 of 5)]
[im 1/80]
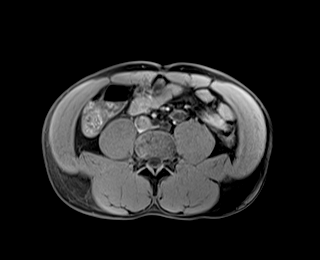
[im 40/80]
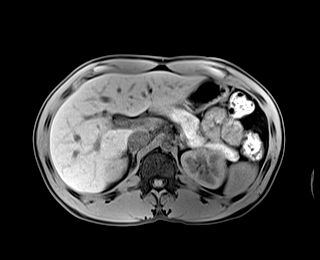
[im 80/80]
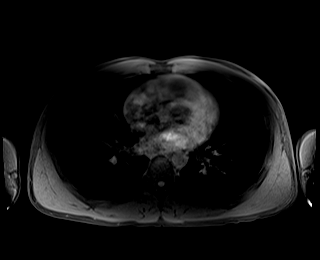

[Series 22: T1 dynamic post-contrast · axial · 3.0mm · 1.19mm/px · z∈[-233,+4]mm · 3 of 80 slices shown (1 of 5)]
[im 1/80]
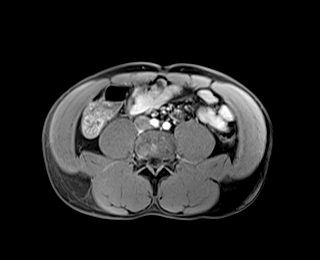
[im 40/80]
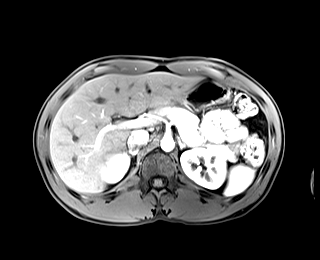
[im 80/80]
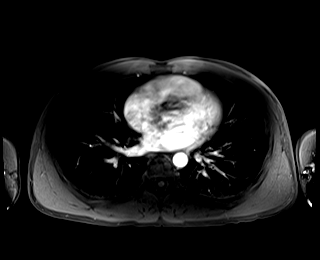

[Series 23: T1 dynamic · axial · 3.0mm · 1.19mm/px · z∈[-233,+4]mm · 3 of 80 slices shown (2 of 5)]
[im 1/80]
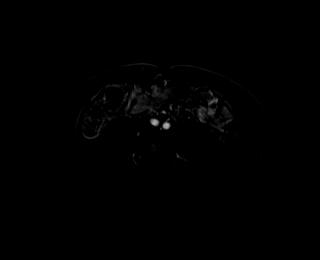
[im 40/80]
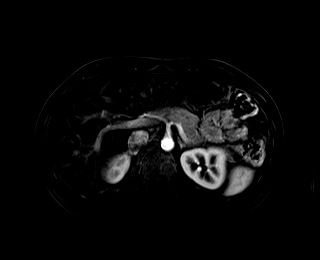
[im 80/80]
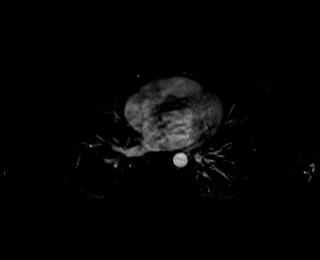

[Series 24: T1 dynamic post-contrast · axial · 3.0mm · 1.19mm/px · z∈[-233,+4]mm · 3 of 80 slices shown (2 of 5)]
[im 1/80]
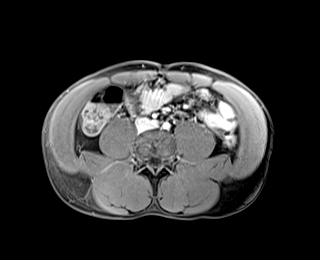
[im 40/80]
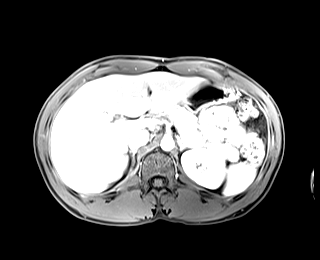
[im 80/80]
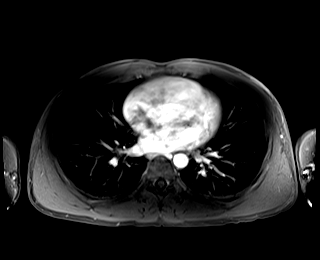

[Series 25: T1 dynamic · axial · 3.0mm · 1.19mm/px · z∈[-233,+4]mm · 3 of 80 slices shown (3 of 5)]
[im 1/80]
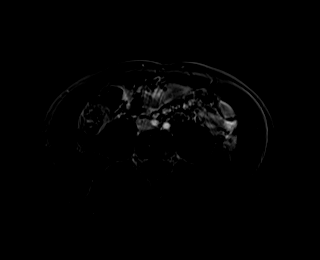
[im 40/80]
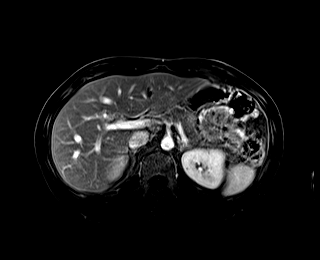
[im 80/80]
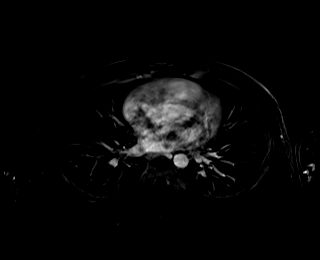

[Series 26: T1 dynamic post-contrast · axial · 3.0mm · 1.19mm/px · z∈[-233,+4]mm · 3 of 80 slices shown (3 of 5)]
[im 1/80]
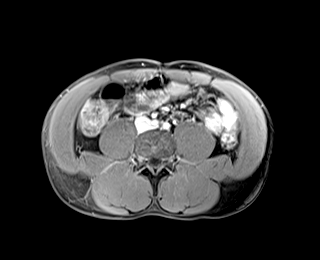
[im 40/80]
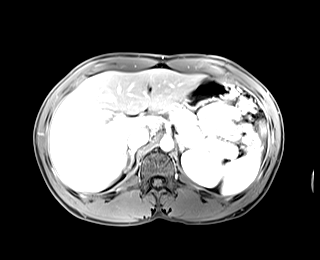
[im 80/80]
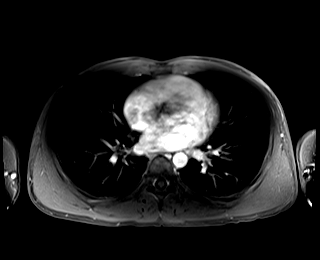

[Series 27: T1 dynamic · axial · 3.0mm · 1.19mm/px · z∈[-233,+4]mm · 3 of 80 slices shown (4 of 5)]
[im 1/80]
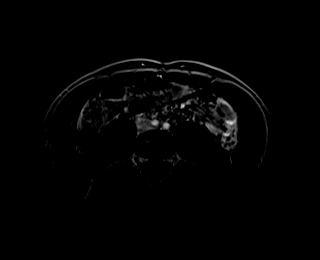
[im 40/80]
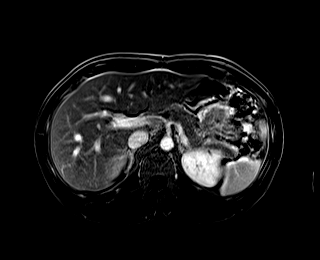
[im 80/80]
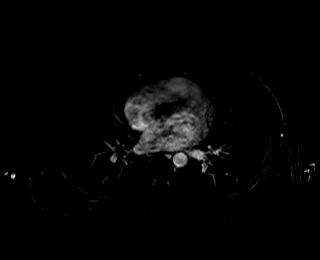

[Series 28: T1 dynamic post-contrast · axial · 3.0mm · 1.19mm/px · z∈[-233,+4]mm · 3 of 80 slices shown (4 of 5)]
[im 1/80]
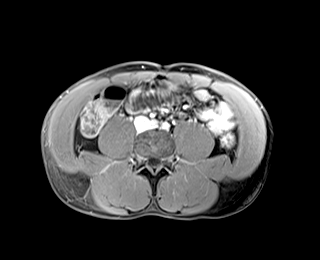
[im 40/80]
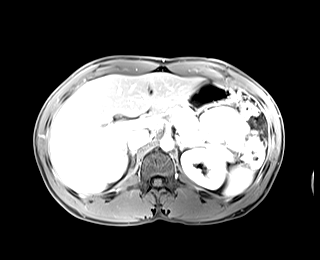
[im 80/80]
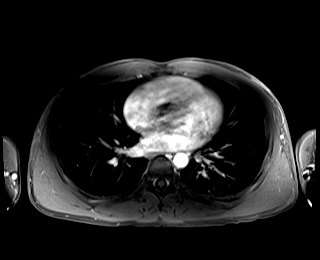

[Series 29: T1 dynamic · axial · 3.0mm · 1.19mm/px · z∈[-233,+4]mm · 3 of 80 slices shown (5 of 5)]
[im 1/80]
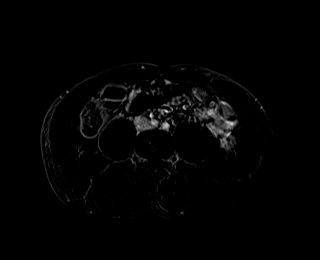
[im 40/80]
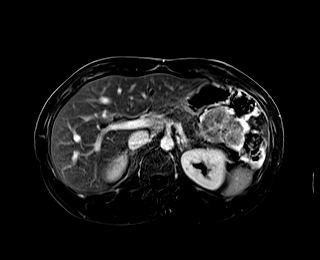
[im 80/80]
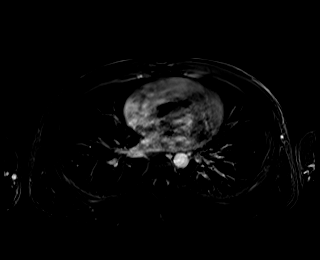

[Series 30: T1 dynamic post-contrast · coronal · 3.0mm · 1.31mm/px · 2 of 64 slices shown (5 of 5)]
[im 1/64]
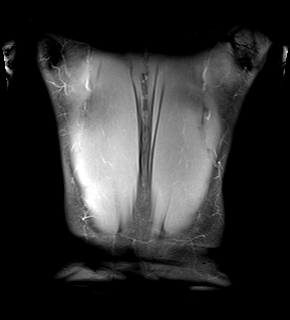
[im 64/64]
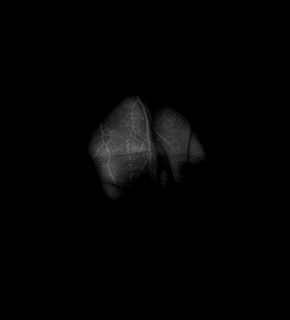

[43 of 48 positions shown; findings below may reference images not displayed]

FINDINGS: Lower chest: No acute abnormality.

Hepatobiliary: No hepatic steatosis. No solid enhancing hepatic
lesion. Gallbladder is unremarkable. No biliary ductal dilation.

Pancreas: Well-circumscribed 5 mm cystic lesion in the head of the
pancreas on image [DATE]. No enhancing soft tissue nodularity or
wall/septal thickening. No pancreatic ductal dilation. Pancreas
demonstrates homogeneous postcontrast enhancement with normal
intrinsic T1 signal.

Spleen:  Within normal limits in size and appearance.

Adrenals/Urinary Tract: Bilateral adrenal glands appear normal. No
hydronephrosis. No solid enhancing renal mass. Tiny right lower pole
renal cyst.

Stomach/Bowel: Visualized portions within the abdomen are
unremarkable.

Vascular/Lymphatic: No pathologically enlarged lymph nodes
identified. No abdominal aortic aneurysm demonstrated.

Other:  No abdominal ascites.

Musculoskeletal: No suspicious bone lesions identified.
IMPRESSION: Well-circumscribed 5 mm cystic lesion in the head of the pancreas,
without suspicious MRI features. Recommend follow up pre and post
contrast MRI/MRCP in 1 year. This recommendation follows ACR
consensus guidelines: Management of Incidental Pancreatic Cysts: A
White Paper of the ACR Incidental Findings Committee. [HOSPITAL] [IA];[DATE].

## 2021-09-20 MED ORDER — GADOBUTROL 1 MMOL/ML IV SOLN
6.4000 mL | Freq: Once | INTRAVENOUS | Status: AC | PRN
  Administered 2021-09-20: 6.4 mL via INTRAVENOUS

## 2021-09-23 ENCOUNTER — Encounter (HOSPITAL_COMMUNITY): Payer: BC Managed Care – PPO

## 2021-12-26 ENCOUNTER — Encounter (HOSPITAL_COMMUNITY): Payer: Self-pay | Admitting: Emergency Medicine

## 2021-12-26 ENCOUNTER — Ambulatory Visit (HOSPITAL_COMMUNITY)
Admission: EM | Admit: 2021-12-26 | Discharge: 2021-12-26 | Disposition: A | Discharge status: Home / Self Care | Payer: BC Managed Care – PPO | Attending: Physician Assistant | Admitting: Physician Assistant

## 2021-12-26 DIAGNOSIS — B085 Enteroviral vesicular pharyngitis: Secondary | ICD-10-CM | POA: Insufficient documentation

## 2021-12-26 DIAGNOSIS — J029 Acute pharyngitis, unspecified: Secondary | ICD-10-CM | POA: Insufficient documentation

## 2021-12-26 LAB — POCT RAPID STREP A, ED / UC: Streptococcus, Group A Screen (Direct): NEGATIVE

## 2021-12-26 MED ORDER — DIPHENHYDRAMINE HCL 12.5 MG/5ML PO LIQD
5.0000 mL | Freq: Four times a day (QID) | ORAL | 0 refills | Status: DC | PRN
Start: 2021-12-26 — End: 2022-07-01

## 2021-12-26 NOTE — ED Provider Notes (Signed)
[place]    CSN: [id number] Arrival date & time: 12/26/21  0801      History   Chief Complaint Chief Complaint  Patient presents with   Sore Throat    HPI Brett Henry is a 31 y.o. male.   Patient presents today with a 1 day history of severe sore throat and right otalgia.  Denies any additional symptoms including cough, congestion, fever, nausea, vomiting, chest pain, shortness of breath.  Reports sore throat pain is significant and interfering with his ability to swallow though he is able to manage his own secretions.  He has not tried any over-the-counter medication for symptom management.  He denies any known sick contacts but does work at a daycare.  He denies any recent antibiotic or steroid use.  He denies history of immunosuppression.  Pain is rated 10 on a 0-10 pain scale, described as burning/sharp, worse with swallowing, no alleviating factors identified.  Denies any muffled voice, throat swelling, shortness of breath.   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 Authorizing Provider  magic mouthwash (nystatin, lidocaine, diphenhydrAMINE) suspension Take 5 mLs by mouth 4 (four) times daily as needed for mouth pain. Swish and spit 12/26/21  Yes Burl Tauzin K, PA-C  albuterol (VENTOLIN HFA) 108 (90 Base) MCG/ACT inhaler Inhale 2 puffs into the lungs every 6 (six) hours as needed for shortness of breath and wheezing. 07/18/21   Nafziger, Tommi Rumps, NP  Cholecalciferol (VITAMIN D-3) 125 MCG (5000 UT) TABS  Take 1 tablet by mouth daily. 02/26/21   Hilts, Legrand Como, MD  omeprazole (PRILOSEC) 20 MG capsule Take 20 mg by mouth daily.    [provider]  pantoprazole (PROTONIX) 40 MG tablet Take 1 tablet (40 mg total) by mouth daily. 07/18/21   Nafziger, Tommi Rumps, NP  sucralfate (CARAFATE) 1 g tablet Take 1 tablet (1 g total) by mouth 4 (four) times daily -  with meals and at bedtime. 07/18/21 08/20/21  Nafziger, Tommi Rumps, NP  tiZANidine (ZANAFLEX) 2 MG tablet Take 1 tablet (2 mg total) by mouth every 6 (six) hours as needed for muscle spasms. 07/18/21   Nafziger, Tommi Rumps, NP    Family History Family History  Problem Relation Age of Onset   Asthma Mother    Gout Father    Diabetes Maternal Grandmother     Social History Social History   Tobacco Use   Smoking status: Former    Packs/day: 1.00    Years: 10.00    Pack years: 10.00    Types: Cigarettes   Smokeless tobacco: Never  Vaping Use   Vaping Use: Never used  Substance Use Topics   Alcohol use: Yes    Comment: 1 per day   Drug use: No     Allergies   Tylenol [acetaminophen], Peanut-containing drug products, and Shellfish allergy   Review of Systems Review of Systems  Constitutional:  Positive for activity change. Negative for appetite change,  fatigue and fever.  HENT:  Positive for sore throat and trouble swallowing. Negative for congestion, sinus pressure, sneezing and voice change.   Respiratory:  Negative for cough and shortness of breath.   Cardiovascular:  Negative for chest pain.  Gastrointestinal:  Negative for abdominal pain, diarrhea, nausea and vomiting.  Neurological:  Negative for dizziness, light-headedness and headaches.    Physical Exam Triage Vital Signs ED Triage Vitals  Enc Vitals Group     BP 12/26/21 0832 128/83     Pulse Rate 12/26/21 0832 84     Resp 12/26/21 0832 17     Temp 12/26/21 0832 98.3 F (36.8 C)     Temp src --      SpO2 12/26/21 0832 98 %     Weight --      Height --      Head  Circumference --      Peak Flow --      Pain Score 12/26/21 0831 10     Pain Loc --      Pain Edu? --      Excl. in [place]? --    No data found.  Updated Vital Signs BP 128/83   Pulse 84   Temp 98.3 F (36.8 C)   Resp 17   SpO2 98%   Visual Acuity Right Eye Distance:   Left Eye Distance:   Bilateral Distance:    Right Eye Near:   Left Eye Near:    Bilateral Near:     Physical Exam Vitals reviewed.  Constitutional:      General: He is awake.     Appearance: Normal appearance. He is well-developed. He is not ill-appearing.     Comments: Very pleasant male appears stated age in no acute distress sitting comfortably in exam room  HENT:     Head: Normocephalic and atraumatic.     Right Ear: Tympanic membrane, ear canal and external ear normal. Tympanic membrane is not erythematous or bulging.     Left Ear: Tympanic membrane, ear canal and external ear normal. Tympanic membrane is not erythematous or bulging.     Nose: Nose normal.     Mouth/Throat:     Palate: Lesions present.     Pharynx: Uvula midline. Posterior oropharyngeal erythema present. No oropharyngeal exudate or uvula swelling.     Tonsils: No tonsillar exudate or tonsillar abscesses.     Comments: Significant erythema over posterior oropharynx with several ulcerated lesions on tonsillar pillars.  No significant exudate or evidence of peritonsillar abscess. Cardiovascular:     Rate and Rhythm: Normal rate and regular rhythm.     Heart sounds: Normal heart sounds, S1 normal and S2 normal. No murmur heard. Pulmonary:     Effort: Pulmonary effort is normal. No accessory muscle usage or respiratory distress.     Breath sounds: Normal breath sounds. No stridor. No wheezing, rhonchi or rales.     Comments: Clear to auscultation bilaterally Abdominal:     General: Bowel sounds are normal.     Palpations: Abdomen is soft.     Tenderness: There is no abdominal tenderness.  Neurological:     Mental Status: He is alert.   Psychiatric:        Behavior: Behavior is cooperative.     UC Treatments / Results  Labs (all labs ordered are listed, but only abnormal results are displayed) Labs Reviewed  CULTURE, GROUP A STREP [facility])  POCT RAPID STREP A, ED / UC    EKG  Radiology No results found.  Procedures Procedures (including critical care time)  Medications Ordered in UC Medications - No data to display  Initial Impression / Assessment and Plan / UC Course  I have reviewed the triage vital signs and the nursing notes.  Pertinent labs & imaging results that were available during my care of the patient were reviewed by me and considered in my medical decision making (see chart for details).     Discussed likely viral etiology given clinical presentation.  Rapid strep was negative in clinic today.  We will send off for culture but defer antibiotics until culture results are available.  Discussed that he should use conservative treatment measures to manage pain with ibuprofen as needed and gargle with warm salt water.  He was prescribed Magic mouthwash for additional symptom relief with instruction to swish and spit as needed for pain relief.  Discussed that if any point anything worsens and he has difficulty swallowing, swelling of his throat, shortness of breath, difficulty swallowing his saliva, worsening pain, fever not responding to medication he needs to be seen immediately.  Strict return precautions given to which he expressed understanding.  Work excuse note provided.  Final Clinical Impressions(s) / UC Diagnoses   Final diagnoses:  Herpangina  Sore throat     Discharge Instructions      I believe they have a virus causing sores in your mouth which is causing your pain.  Gargle with warm salt water multiple times a day.  Continue ibuprofen as needed for pain.  Use Magic mouthwash (swish/gargle and then spit this out) up to 4 times a day for additional pain relief.  If at any point  anything worsens you develop high fever not responding to medication, difficulty swallowing, worsening pain, swelling of your throat, change in your voice, shortness of breath you need to go to the emergency room immediately.     ED Prescriptions     Medication Sig Dispense Auth. Provider   magic mouthwash (nystatin, lidocaine, diphenhydrAMINE) suspension Take 5 mLs by mouth 4 (four) times daily as needed for mouth pain. Swish and spit 180 mL Hebert Dooling K, PA-C      PDMP not reviewed this encounter.   Terrilee Croak, PA-C 12/26/21 [id number]

## 2021-12-26 NOTE — ED Triage Notes (Signed)
Pt is present today with sore throat and right ear pain. Pt sx started this morning.

## 2021-12-26 NOTE — Discharge Instructions (Signed)
I believe they have a virus causing sores in your mouth which is causing your pain.  Gargle with warm salt water multiple times a day.  Continue ibuprofen as needed for pain.  Use Magic mouthwash (swish/gargle and then spit this out) up to 4 times a day for additional pain relief.  If at any point anything worsens you develop high fever not responding to medication, difficulty swallowing, worsening pain, swelling of your throat, change in your voice, shortness of breath you need to go to the emergency room immediately.

## 2021-12-27 LAB — CULTURE, GROUP A STREP (THRC)

## 2021-12-28 ENCOUNTER — Emergency Department (HOSPITAL_COMMUNITY)
Admission: EM | Admit: 2021-12-28 | Discharge: 2021-12-28 | Disposition: A | Discharge status: Home / Self Care | Payer: Self-pay | Attending: Emergency Medicine | Admitting: Emergency Medicine

## 2021-12-28 ENCOUNTER — Other Ambulatory Visit: Payer: Self-pay

## 2021-12-28 ENCOUNTER — Encounter (HOSPITAL_COMMUNITY): Payer: Self-pay | Admitting: Emergency Medicine

## 2021-12-28 ENCOUNTER — Emergency Department (HOSPITAL_COMMUNITY): Payer: Self-pay

## 2021-12-28 DIAGNOSIS — J020 Streptococcal pharyngitis: Secondary | ICD-10-CM | POA: Insufficient documentation

## 2021-12-28 DIAGNOSIS — D72829 Elevated white blood cell count, unspecified: Secondary | ICD-10-CM | POA: Insufficient documentation

## 2021-12-28 LAB — CBC WITH DIFFERENTIAL/PLATELET
Abs Immature Granulocytes: 0.06 10*3/uL (ref 0.00–0.07)
Basophils Absolute: 0.1 10*3/uL (ref 0.0–0.1)
Basophils Relative: 0 %
Eosinophils Absolute: 0.7 10*3/uL — ABNORMAL HIGH (ref 0.0–0.5)
Eosinophils Relative: 5 %
HCT: 45.5 % (ref 39.0–52.0)
Hemoglobin: 15.8 g/dL (ref 13.0–17.0)
Immature Granulocytes: 0 %
Lymphocytes Relative: 12 %
Lymphs Abs: 1.8 10*3/uL (ref 0.7–4.0)
MCH: 31.3 pg (ref 26.0–34.0)
MCHC: 34.7 g/dL (ref 30.0–36.0)
MCV: 90.3 fL (ref 80.0–100.0)
Monocytes Absolute: 1.1 10*3/uL — ABNORMAL HIGH (ref 0.1–1.0)
Monocytes Relative: 7 %
Neutro Abs: 10.7 10*3/uL — ABNORMAL HIGH (ref 1.7–7.7)
Neutrophils Relative %: 76 %
Platelets: 199 10*3/uL (ref 150–400)
RBC: 5.04 MIL/uL (ref 4.22–5.81)
RDW: 12.5 % (ref 11.5–15.5)
WBC: 14.4 10*3/uL — ABNORMAL HIGH (ref 4.0–10.5)
nRBC: 0 % (ref 0.0–0.2)

## 2021-12-28 LAB — BASIC METABOLIC PANEL
Anion gap: 9 (ref 5–15)
BUN: 7 mg/dL (ref 6–20)
CO2: 24 mmol/L (ref 22–32)
Calcium: 8.7 mg/dL — ABNORMAL LOW (ref 8.9–10.3)
Chloride: 108 mmol/L (ref 98–111)
Creatinine, Ser: 1.15 mg/dL (ref 0.61–1.24)
GFR, Estimated: >60 mL/min (ref >60)
Glucose, Bld: 95 mg/dL (ref 70–99)
Potassium: 3.8 mmol/L (ref 3.5–5.1)
Sodium: 141 mmol/L (ref 135–145)

## 2021-12-28 LAB — MONONUCLEOSIS SCREEN: Mono Screen: NEGATIVE

## 2021-12-28 LAB — GROUP A STREP BY PCR: Group A Strep by PCR: DETECTED — AB

## 2021-12-28 IMAGING — CT CT NECK W/ CM
3 of 4 series · 13 of 33 positions shown, 16 images · IV contrast (APPLIED)
Comparison: None Available.

CLINICAL DATA: Epiglottitis or tonsillitis suspected; sore throat
and difficulty swallowing since [REDACTED]

EXAM:
CT NECK WITH CONTRAST
TECHNIQUE: Multidetector CT imaging of the neck was performed using the
standard protocol following the bolus administration of intravenous
contrast.

[Series 5: coronal st · coronal · 0.38mm/px · 3 of 113 slices shown]
[im 23/113  bone]
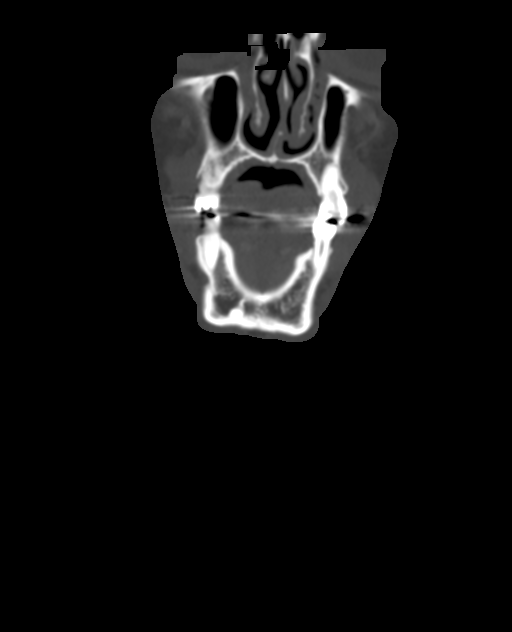
[im 45/113  bone]
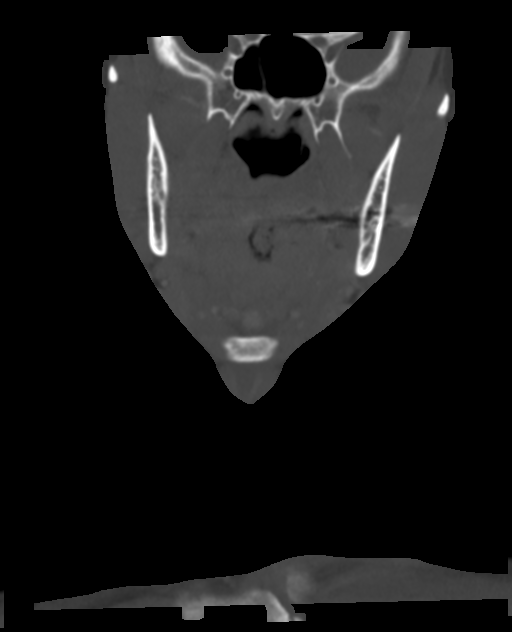
[im 68/113  bone]
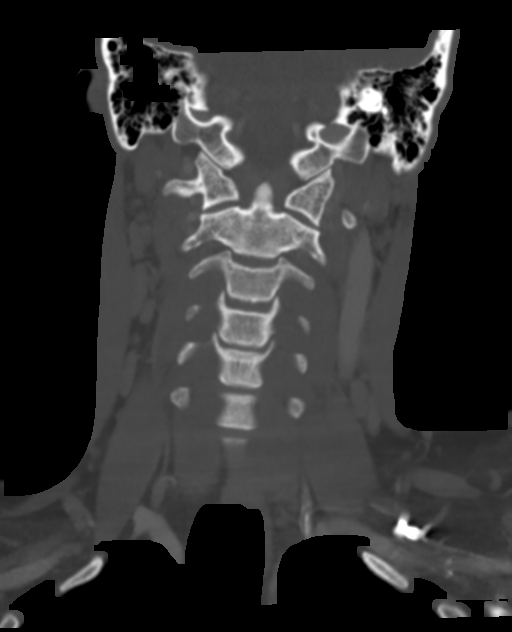

[Series 6: sagittal st · sagittal · 0.46mm/px · 5 of 85 slices shown, 6 images]
[im 29/85  bone]
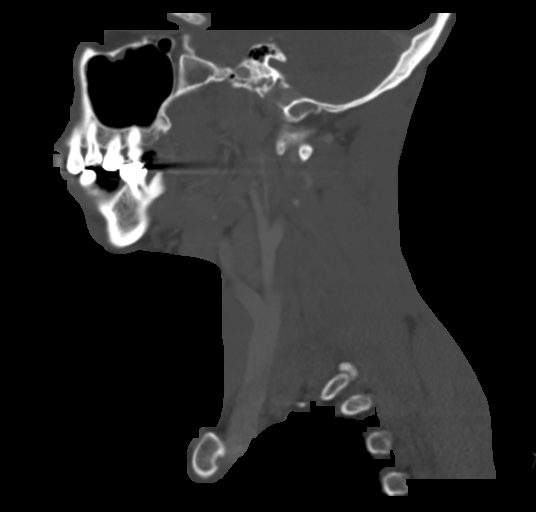
[im 36/85  bone]
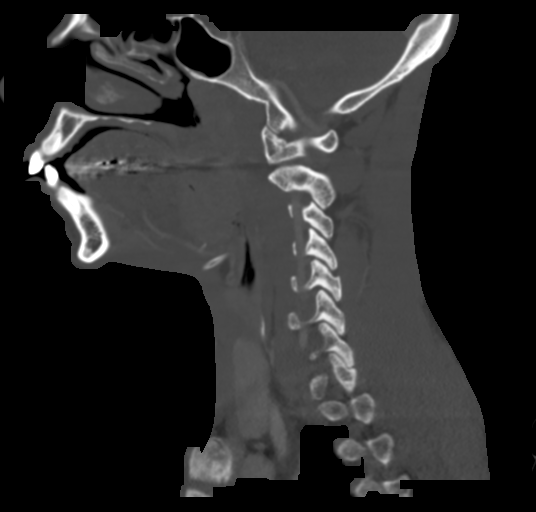
[im 43/85  soft-tissue]
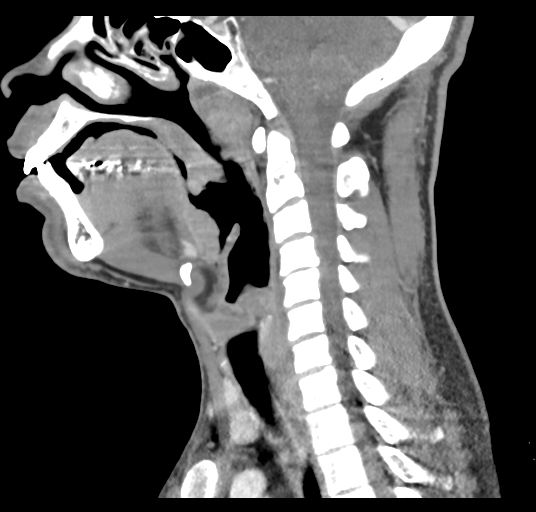
[im 43/85  bone]
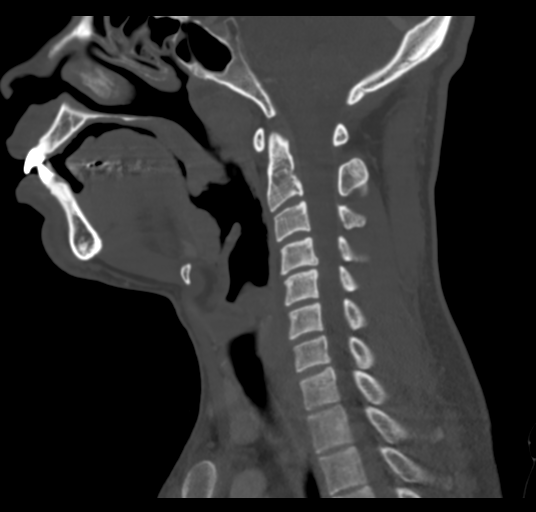
[im 50/85  bone]
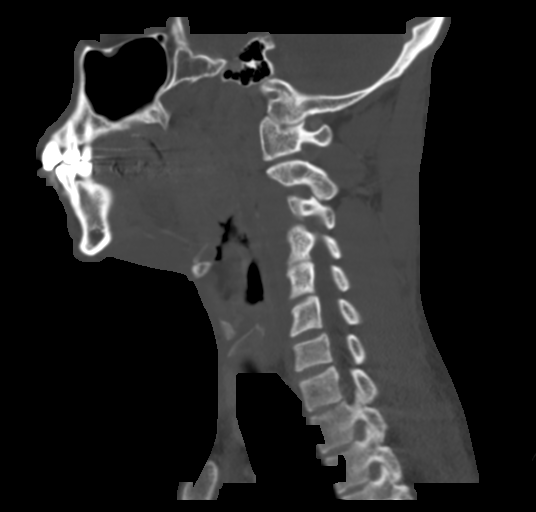
[im 57/85  bone]
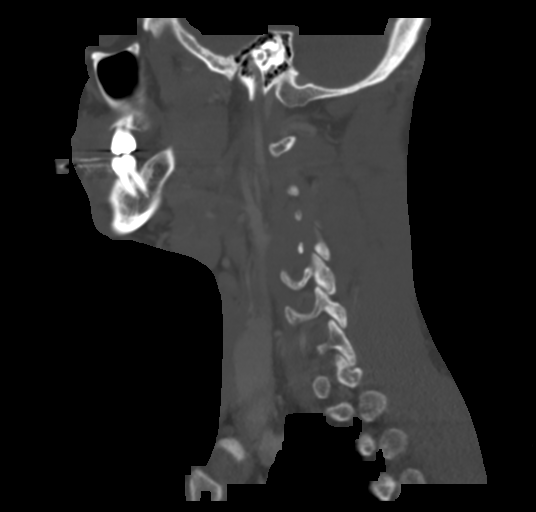

[Series 7: orthogonal st · axial · 0.35mm/px · z∈[-246,-99]mm · 5 of 112 slices shown, 7 images]
[im 19/112  soft-tissue]
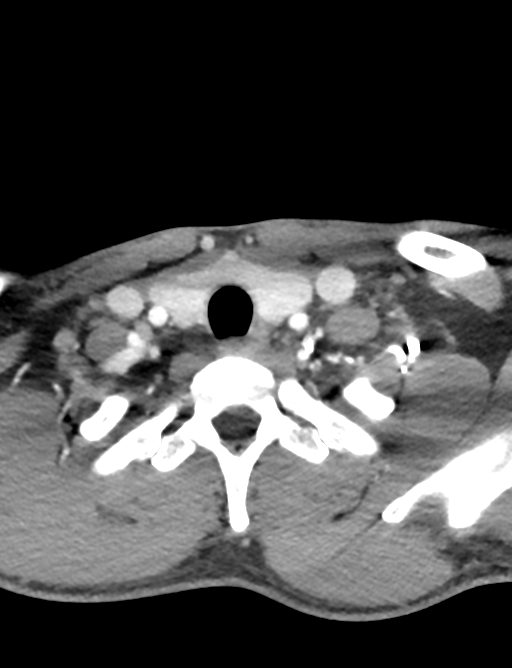
[im 19/112  bone]
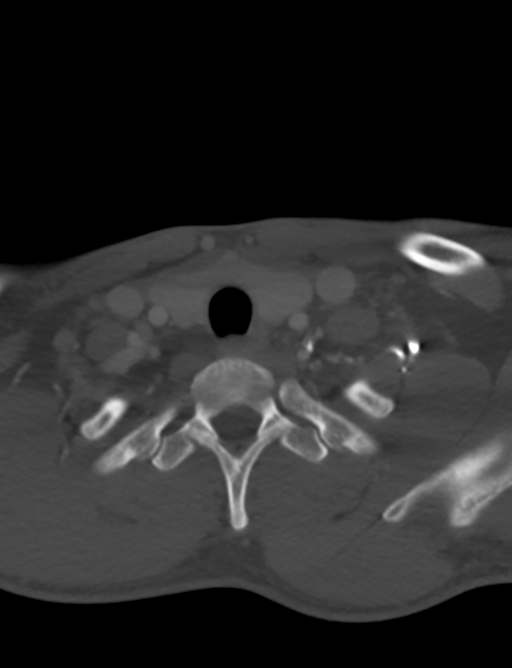
[im 38/112  bone]
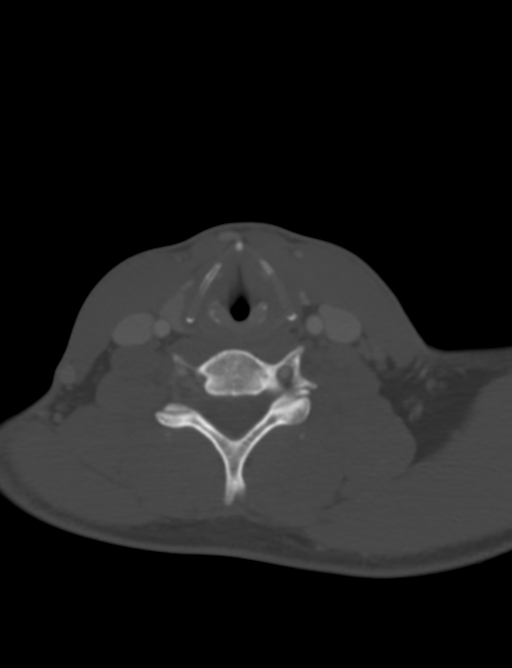
[im 56/112  bone]
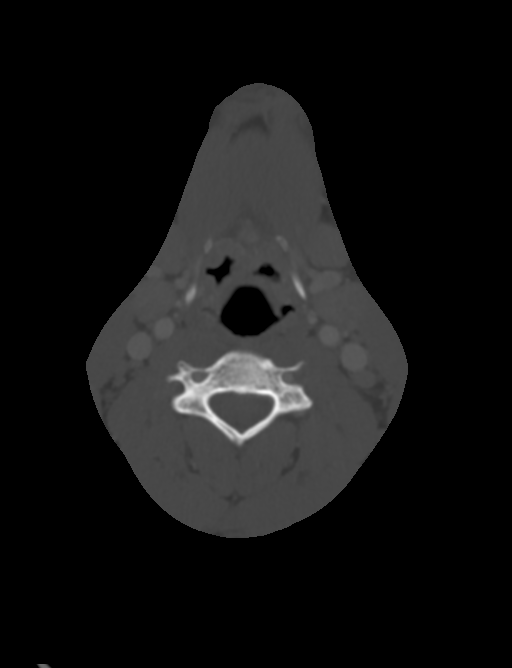
[im 75/112  bone]
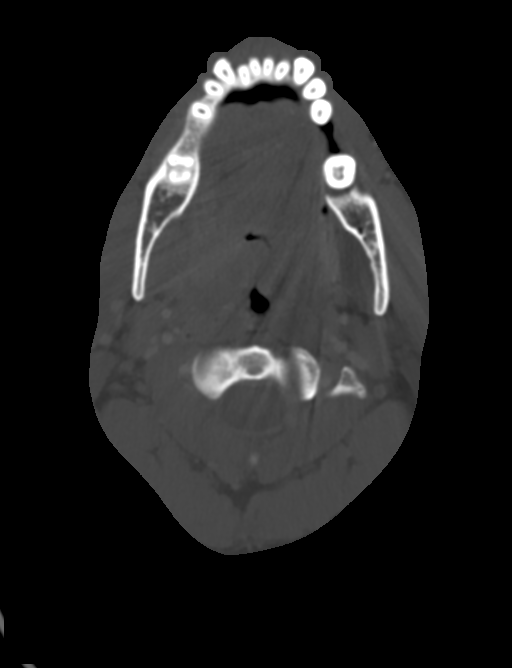
[im 93/112  soft-tissue]
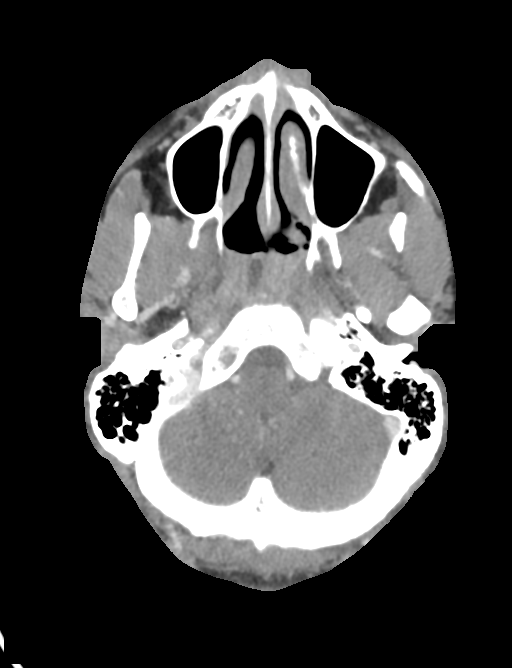
[im 93/112  bone]
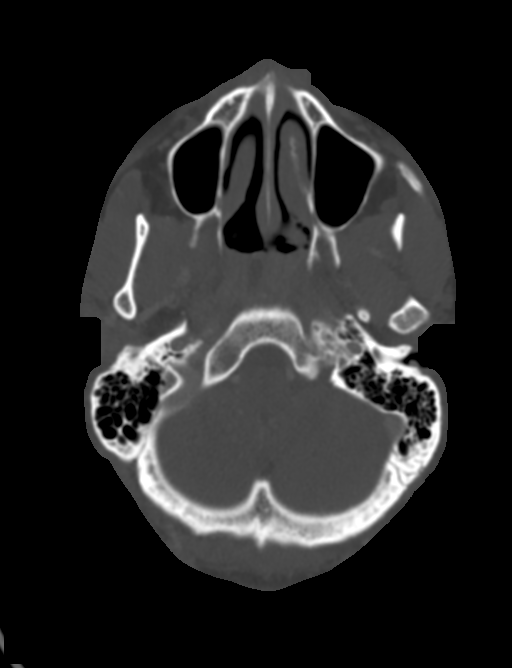

[13 of 33 positions shown; findings below may reference images not displayed]

RADIATION DOSE REDUCTION: This exam was performed according to the
departmental dose-optimization program which includes automated
exposure control, adjustment of the mA and/or kV according to
patient size and/or use of iterative reconstruction technique.

CONTRAST:  75mL OMNIPAQUE IOHEXOL 300 MG/ML  SOLN
FINDINGS: Pharynx and larynx: Enlargement of the adenoids. Enlarged palatine
and lingual tonsils. Mild narrowing of the airway. No evidence of
peritonsillar abscess. There is a 5 mm hyperdense lesion at the base
of tongue just above the hyoid. Larynx is unremarkable with vocal
cords opposed at the time of imaging.

Salivary glands: Parotid and submandibular glands are unremarkable.

Thyroid: Normal.

Lymph nodes: Likely reactive enlarged bilateral level 2 nodes.
Largest measures 1.9 cm.

Vascular: Major neck vessels are patent.

Limited intracranial: No abnormal enhancement.

Visualized orbits: Unremarkable.

Mastoids and visualized paranasal sinuses: Mastoid air cells are
clear. Patchy ethmoid mucosal thickening.

Skeleton: Minor degenerative changes at C4-C5.

Upper chest: No apical lung mass.  Apical blebs on the right.
IMPRESSION: Enlargement of the adenoids and palatine and lingual tonsils.
Bilateral level 2 adenopathy. These findings are likely
infectious/inflammatory given acute onset. There is no peritonsillar
abscess.

5 mm hyperdense lesion at the base of tongue superior to the hyoid
likely reflects lingual thyroid. Normal thyroid tissue is present in
the neck.

## 2021-12-28 MED ORDER — KETOROLAC TROMETHAMINE 30 MG/ML IJ SOLN
30.0000 mg | Freq: Once | INTRAMUSCULAR | Status: AC
  Administered 2021-12-28: 30 mg via INTRAVENOUS
  Filled 2021-12-28: qty 1

## 2021-12-28 MED ORDER — LIDOCAINE VISCOUS HCL 2 % MT SOLN
15.0000 mL | Freq: Once | OROMUCOSAL | Status: AC
  Administered 2021-12-28: 15 mL via OROMUCOSAL
  Filled 2021-12-28: qty 15

## 2021-12-28 MED ORDER — IOHEXOL 300 MG/ML  SOLN
75.0000 mL | Freq: Once | INTRAMUSCULAR | Status: AC | PRN
  Administered 2021-12-28: 75 mL via INTRAVENOUS

## 2021-12-28 MED ORDER — LIDOCAINE VISCOUS HCL 2 % MT SOLN: 15.0000 mL | Freq: Four times a day (QID) | OROMUCOSAL | 0 refills | Status: DC | PRN

## 2021-12-28 MED ORDER — NAPROXEN 500 MG PO TABS: 500.0000 mg | ORAL_TABLET | Freq: Two times a day (BID) | ORAL | 0 refills | Status: DC

## 2021-12-28 MED ORDER — DEXAMETHASONE 4 MG PO TABS
8.0000 mg | ORAL_TABLET | Freq: Once | ORAL | Status: AC
Start: 2021-12-28 — End: 2021-12-28
  Administered 2021-12-28: 8 mg via ORAL
  Filled 2021-12-28: qty 2

## 2021-12-28 MED ORDER — PENICILLIN G BENZATHINE 1200000 UNIT/2ML IM SUSY
1.2000 10*6.[IU] | PREFILLED_SYRINGE | Freq: Once | INTRAMUSCULAR | Status: AC
Start: 2021-12-28 — End: 2021-12-28
  Administered 2021-12-28: 1.2 10*6.[IU] via INTRAMUSCULAR
  Filled 2021-12-28: qty 2

## 2021-12-28 NOTE — Discharge Instructions (Signed)
It was a pleasure taking care of you here in the emergency department today  We have given you steroids which should help with your pain and swelling over the next 3 to 4 days  I have written for additional anti-inflammatory medication which should help.  You received a shot of IV antibiotics here in the emergency department.  You do not need additional antibiotics outpatient.  Make sure to rest, drink plenty of fluids  You will need to be out of work for at least 24 hours after your shot.  I have written you a work note

## 2021-12-28 NOTE — ED Provider Notes (Signed)
[facility] EMERGENCY DEPARTMENT Provider Note   CSN: [id number] Arrival date & time: 12/28/21  1508     History  Chief Complaint  Patient presents with   Sore Throat    Brett Henry is a 31 y.o. male past medical history here for evaluation of sore throat and difficulty swallowing.  Began on Friday.  Was seen at urgent care had negative strep test.  Was told he likely had a viral infection.  He has been using Magic mouthwash at home, Tylenol and Motrin without relief of his pain.  Pain worse with swallowing.  No voice changes.  No neck stiffness or neck rigidity.  No fever, vomiting.  Had anything like this previously.  No concern for intraoral STD. No recent out of country travel, up to date on immunizations  HPI     Home Medications Prior to Admission medications   Medication Sig Start Date End Date Taking? Authorizing Provider  lidocaine (XYLOCAINE) 2 % solution Use as directed 15 mLs in the mouth or throat every 6 (six) hours as needed for mouth pain. 12/28/21  Yes Zvi Duplantis A, PA-C  naproxen (NAPROSYN) 500 MG tablet Take 1 tablet (500 mg total) by mouth 2 (two) times daily. 12/28/21  Yes Lacrystal Barbe A, PA-C  albuterol (VENTOLIN HFA) 108 (90 Base) MCG/ACT inhaler Inhale 2 puffs into the lungs every 6 (six) hours as needed for shortness of breath and wheezing. 07/18/21   Nafziger, Kandee Keen, NP  Cholecalciferol (VITAMIN D-3) 125 MCG (5000 UT) TABS Take 1 tablet by mouth daily. 02/26/21   Hilts, Casimiro Needle, MD  magic mouthwash (nystatin, lidocaine, diphenhydrAMINE) suspension Take 5 mLs by mouth 4 (four) times daily as needed for mouth pain. Swish and spit 12/26/21   Raspet, Denny Peon K, PA-C  omeprazole (PRILOSEC) 20 MG capsule Take 20 mg by mouth daily.    [provider]  pantoprazole (PROTONIX) 40 MG tablet Take 1 tablet (40 mg total) by mouth daily. 07/18/21   Nafziger, Kandee Keen, NP  sucralfate (CARAFATE) 1 g tablet Take 1 tablet (1 g total) by mouth 4  (four) times daily -  with meals and at bedtime. 07/18/21 08/20/21  Nafziger, Kandee Keen, NP  tiZANidine (ZANAFLEX) 2 MG tablet Take 1 tablet (2 mg total) by mouth every 6 (six) hours as needed for muscle spasms. 07/18/21   Shirline Frees, NP      Allergies    Tylenol [acetaminophen], Peanut-containing drug products, and Shellfish allergy    Review of Systems   Review of Systems  Constitutional: Negative.   HENT:  Positive for sore throat and trouble swallowing. Negative for congestion, drooling, ear discharge, ear pain, facial swelling, nosebleeds, postnasal drip, rhinorrhea, sinus pressure, sinus pain, sneezing and voice change.   Respiratory: Negative.    Cardiovascular: Negative.   Gastrointestinal: Negative.   Genitourinary: Negative.   Musculoskeletal: Negative.   Neurological: Negative.   All other systems reviewed and are negative.  Physical Exam Updated Vital Signs BP 128/74   Pulse 88   Temp 98.8 F (37.1 C) (Oral)   Resp 16   SpO2 100%  Physical Exam Vitals and nursing note reviewed.  Constitutional:      General: He is not in acute distress.    Appearance: He is well-developed. He is not ill-appearing, toxic-appearing or diaphoretic.  HENT:     Head: Normocephalic and atraumatic.     Jaw: There is normal jaw occlusion.     Comments: No drooling, dysphagia or trismus No facial swelling,  induration, evidence of Ludwigs angina    Right Ear: Tympanic membrane and ear canal normal.     Left Ear: Tympanic membrane and ear canal normal.     Nose: Nose normal. No congestion or rhinorrhea.     Comments: No congestion or rhinorrhea    Mouth/Throat:     Lips: Pink.     Mouth: Mucous membranes are moist.     Dentition: No dental tenderness, gingival swelling, dental caries or dental abscesses.     Tongue: No lesions. Tongue does not deviate from midline.     Pharynx: Uvula midline. Oropharyngeal exudate, posterior oropharyngeal erythema and uvula swelling present. No pharyngeal  swelling.     Tonsils: Tonsillar exudate present. No tonsillar abscesses. 2+ on the right. 2+ on the left.     Comments: Tonsils 2+ bilaterally with white plaques. Mild pooling of secretions. Uvula midline. Tongue midline, no sublingual swelling Eyes:     Pupils: Pupils are equal, round, and reactive to light.  Neck:     Trachea: Trachea and phonation normal.     Comments: No neck stiffness neck rigidity.  Cervical lymphadenopathy present bilaterally Cardiovascular:     Rate and Rhythm: Normal rate and regular rhythm.     Pulses: Normal pulses.     Heart sounds: Normal heart sounds.  Pulmonary:     Effort: Pulmonary effort is normal. No respiratory distress.     Breath sounds: Normal breath sounds and air entry.  Abdominal:     General: There is no distension.     Palpations: Abdomen is soft.  Musculoskeletal:        General: Normal range of motion.     Cervical back: Full passive range of motion without pain, normal range of motion and neck supple.  Lymphadenopathy:     Cervical: Cervical adenopathy present.  Skin:    General: Skin is warm and dry.  Neurological:     General: No focal deficit present.     Mental Status: He is alert and oriented to person, place, and time.    ED Results / Procedures / Treatments   Labs (all labs ordered are listed, but only abnormal results are displayed) Labs Reviewed  GROUP A STREP BY PCR - Abnormal; Notable for the following components:      Result Value   Group A Strep by PCR DETECTED (*)    All other components within normal limits  CBC WITH DIFFERENTIAL/PLATELET - Abnormal; Notable for the following components:   WBC 14.4 (*)    Neutro Abs 10.7 (*)    Monocytes Absolute 1.1 (*)    Eosinophils Absolute 0.7 (*)    All other components within normal limits  BASIC METABOLIC PANEL - Abnormal; Notable for the following components:   Calcium 8.7 (*)    All other components within normal limits  MONONUCLEOSIS SCREEN     EKG None  Radiology CT Soft Tissue Neck W Contrast  Result Date: 12/28/2021 CLINICAL DATA:  Epiglottitis or tonsillitis suspected; sore throat and difficulty swallowing since Friday EXAM: CT NECK WITH CONTRAST TECHNIQUE: Multidetector CT imaging of the neck was performed using the standard protocol following the bolus administration of intravenous contrast. RADIATION DOSE REDUCTION: This exam was performed according to the departmental dose-optimization program which includes automated exposure control, adjustment of the mA and/or kV according to patient size and/or use of iterative reconstruction technique. CONTRAST:  75mL OMNIPAQUE IOHEXOL 300 MG/ML  SOLN COMPARISON:  None Available. FINDINGS: Pharynx and larynx: Enlargement of the  adenoids. Enlarged palatine and lingual tonsils. Mild narrowing of the airway. No evidence of peritonsillar abscess. There is a 5 mm hyperdense lesion at the base of tongue just above the hyoid. Larynx is unremarkable with vocal cords opposed at the time of imaging. Salivary glands: Parotid and submandibular glands are unremarkable. Thyroid: Normal. Lymph nodes: Likely reactive enlarged bilateral level 2 nodes. Largest measures 1.9 cm. Vascular: Major neck vessels are patent. Limited intracranial: No abnormal enhancement. Visualized orbits: Unremarkable. Mastoids and visualized paranasal sinuses: Mastoid air cells are clear. Patchy ethmoid mucosal thickening. Skeleton: Minor degenerative changes at C4-C5. Upper chest: No apical lung mass.  Apical blebs on the right. IMPRESSION: Enlargement of the adenoids and palatine and lingual tonsils. Bilateral level 2 adenopathy. These findings are likely infectious/inflammatory given acute onset. There is no peritonsillar abscess. 5 mm hyperdense lesion at the base of tongue superior to the hyoid likely reflects lingual thyroid. Normal thyroid tissue is present in the neck. Electronically Signed   By: Guadlupe SpanishPraneil  Patel M.D.   On:  12/28/2021 18:03    Procedures Procedures    Medications Ordered in ED Medications  lidocaine (XYLOCAINE) 2 % viscous mouth solution 15 mL (15 mLs Mouth/Throat Given 12/28/21 1705)  ketorolac (TORADOL) 30 MG/ML injection 30 mg (30 mg Intravenous Given 12/28/21 1703)  iohexol (OMNIPAQUE) 300 MG/ML solution 75 mL (75 mLs Intravenous Contrast Given 12/28/21 1735)  dexamethasone (DECADRON) tablet 8 mg (8 mg Oral Given 12/28/21 1909)  penicillin g benzathine (BICILLIN LA) 1200000 UNIT/2ML injection 1.2 Million Units (1.2 Million Units Intramuscular Given 12/28/21 1909)    ED Course/ Medical Decision Making/ A&P    31 year old no chronic medical problems here for evaluation of sore throat and difficulty swallowing.  Symptoms started on Friday.  Initially seen at urgent care had negative strep test.  Using OTC medications as well as Magic mouthwash without relief.  States today it hurts too bad to swallow.  Denies any shortness of breath, neck pain, neck stiffness, fever.  On arrival he is afebrile, nonseptic, not ill-appearing.  He has no neck stiffness or neck rigidity.  He has no phonation changes.  Posterior oropharynx with 2+ tonsils bilaterally with moderate exudate, uvula midline.  Does have some pooling of secretions however suspect most likely due to pain however given this will obtain CT scan.  I reviewed the urgent care note.  Labs and imaging personally viewed and interpreted:  CBC leukocytosis 14.4 Metabolic panel without significant abnormality Mono negative Strep positive CT soft tissue neck with likely infectious tonsillitis.  No abscess  Patient reassessed.  Feels improved after medication here in ED.  Tolerating p.o. intake.  I discussed his labs and imaging.  He elects for IM Bicillin.  Will write for anti-inflammatory medication at home.  At this time I low suspicion for meningitis, deep space infection, PTA, RPA, Ludwig's angina, sepsis.  Do not feel based off of history, exam,  labs, work-up here in the emergency room that patient requires additional labs, imaging, hospitalization at this time  The patient has been appropriately medically screened and/or stabilized in the ED. I have low suspicion for any other emergent medical condition which would require further screening, evaluation or treatment in the ED or require inpatient management.  Patient is hemodynamically stable and in no acute distress.  Patient able to ambulate in department prior to ED.  Evaluation does not show acute pathology that would require ongoing or additional emergent interventions while in the emergency department or further inpatient treatment.  I have discussed the diagnosis with the patient and answered all questions.  Pain is been managed while in the emergency department and patient has no further complaints prior to discharge.  Patient is comfortable with plan discussed in room and is stable for discharge at this time.  I have discussed strict return precautions for returning to the emergency department.  Patient was encouraged to follow-up with PCP/specialist refer to at discharge.                            Medical Decision Making Amount and/or Complexity of Data Reviewed External Data Reviewed: labs, radiology and notes. Labs: ordered. Decision-making details documented in ED Course. Radiology: ordered and independent interpretation performed. Decision-making details documented in ED Course.  Risk OTC drugs. Prescription drug management. Decision regarding hospitalization. Diagnosis or treatment significantly limited by social determinants of health.         Final Clinical Impression(s) / ED Diagnoses Final diagnoses:  Strep pharyngitis    Rx / DC Orders ED Discharge Orders          Ordered    naproxen (NAPROSYN) 500 MG tablet  2 times daily        12/28/21 1837    lidocaine (XYLOCAINE) 2 % solution  Every 6 hours PRN        12/28/21 1837               Kalaysia Demonbreun A, PA-C 12/28/21 2014    Charlynne Pander, MD 12/28/21 2324

## 2021-12-28 NOTE — ED Notes (Signed)
Pt verbalized understanding of d/c instructions, meds, and followup care. Denies questions. VSS, no distress noted. Steady gait to exit with all belongings.  ?

## 2021-12-28 NOTE — ED Triage Notes (Signed)
C/o sore throat and difficulty swallowing since Friday.  States he was seen at [facility] and strep test was negative.

## 2021-12-29 ENCOUNTER — Other Ambulatory Visit (HOSPITAL_COMMUNITY): Payer: Self-pay

## 2021-12-29 ENCOUNTER — Telehealth: Payer: Self-pay

## 2021-12-29 MED ORDER — AMOXICILLIN 500 MG PO CAPS
500.0000 mg | ORAL_CAPSULE | Freq: Two times a day (BID) | ORAL | 0 refills | Status: AC
  Filled 2021-12-29: qty 20, 10d supply, fill #0

## 2022-01-06 ENCOUNTER — Other Ambulatory Visit (HOSPITAL_COMMUNITY): Payer: Self-pay

## 2022-06-15 ENCOUNTER — Telehealth: Payer: Medicaid Other | Admitting: Family Medicine

## 2022-06-15 DIAGNOSIS — K920 Hematemesis: Secondary | ICD-10-CM

## 2022-06-15 NOTE — Progress Notes (Signed)
[place]  Pt reports vomiting blood, needs lab work and in person eval  Patient acknowledged agreement and understanding of the plan.

## 2022-06-16 ENCOUNTER — Emergency Department (HOSPITAL_BASED_OUTPATIENT_CLINIC_OR_DEPARTMENT_OTHER)
Admission: EM | Admit: 2022-06-16 | Discharge: 2022-06-16 | Disposition: A | Discharge status: Home / Self Care | Payer: BC Managed Care – PPO | Attending: Emergency Medicine | Admitting: Emergency Medicine

## 2022-06-16 ENCOUNTER — Encounter (HOSPITAL_BASED_OUTPATIENT_CLINIC_OR_DEPARTMENT_OTHER): Payer: Self-pay

## 2022-06-16 ENCOUNTER — Other Ambulatory Visit: Payer: Self-pay

## 2022-06-16 ENCOUNTER — Other Ambulatory Visit (HOSPITAL_BASED_OUTPATIENT_CLINIC_OR_DEPARTMENT_OTHER): Payer: Self-pay

## 2022-06-16 ENCOUNTER — Ambulatory Visit: Payer: Medicaid Other

## 2022-06-16 DIAGNOSIS — Z79899 Other long term (current) drug therapy: Secondary | ICD-10-CM | POA: Diagnosis not present

## 2022-06-16 DIAGNOSIS — K2900 Acute gastritis without bleeding: Secondary | ICD-10-CM

## 2022-06-16 DIAGNOSIS — F1020 Alcohol dependence, uncomplicated: Secondary | ICD-10-CM | POA: Diagnosis not present

## 2022-06-16 DIAGNOSIS — K2920 Alcoholic gastritis without bleeding: Secondary | ICD-10-CM | POA: Diagnosis not present

## 2022-06-16 DIAGNOSIS — K2901 Acute gastritis with bleeding: Secondary | ICD-10-CM | POA: Diagnosis not present

## 2022-06-16 DIAGNOSIS — Z91010 Allergy to peanuts: Secondary | ICD-10-CM | POA: Insufficient documentation

## 2022-06-16 DIAGNOSIS — F1090 Alcohol use, unspecified, uncomplicated: Secondary | ICD-10-CM | POA: Diagnosis not present

## 2022-06-16 DIAGNOSIS — R109 Unspecified abdominal pain: Secondary | ICD-10-CM | POA: Diagnosis not present

## 2022-06-16 LAB — COMPREHENSIVE METABOLIC PANEL
ALT: 17 U/L (ref 0–44)
AST: 16 U/L (ref 15–41)
Albumin: 4.5 g/dL (ref 3.5–5.0)
Alkaline Phosphatase: 49 U/L (ref 38–126)
Anion gap: 7 (ref 5–15)
BUN: 8 mg/dL (ref 6–20)
CO2: 28 mmol/L (ref 22–32)
Calcium: 9.8 mg/dL (ref 8.9–10.3)
Chloride: 104 mmol/L (ref 98–111)
Creatinine, Ser: 0.99 mg/dL (ref 0.61–1.24)
GFR, Estimated: >60 mL/min (ref >60)
Glucose, Bld: 97 mg/dL (ref 70–99)
Potassium: 4.5 mmol/L (ref 3.5–5.1)
Sodium: 139 mmol/L (ref 135–145)
Total Bilirubin: 0.4 mg/dL (ref 0.3–1.2)
Total Protein: 7.6 g/dL (ref 6.5–8.1)

## 2022-06-16 LAB — CBC
HCT: 44.1 % (ref 39.0–52.0)
Hemoglobin: 14.9 g/dL (ref 13.0–17.0)
MCH: 30.7 pg (ref 26.0–34.0)
MCHC: 33.8 g/dL (ref 30.0–36.0)
MCV: 90.7 fL (ref 80.0–100.0)
Platelets: 183 10*3/uL (ref 150–400)
RBC: 4.86 MIL/uL (ref 4.22–5.81)
RDW: 12.8 % (ref 11.5–15.5)
WBC: 9.5 10*3/uL (ref 4.0–10.5)
nRBC: 0 % (ref 0.0–0.2)

## 2022-06-16 LAB — CBG MONITORING, ED: Glucose-Capillary: 90 mg/dL (ref 70–99)

## 2022-06-16 LAB — LIPASE, BLOOD: Lipase: 17 U/L (ref 11–51)

## 2022-06-16 MED ORDER — PANTOPRAZOLE SODIUM 40 MG PO TBEC: 40.0000 mg | DELAYED_RELEASE_TABLET | Freq: Once | ORAL | Status: DC

## 2022-06-16 MED ORDER — METOCLOPRAMIDE HCL 5 MG/ML IJ SOLN
10.0000 mg | Freq: Once | INTRAMUSCULAR | Status: AC
  Administered 2022-06-16: 10 mg via INTRAVENOUS
  Filled 2022-06-16: qty 2

## 2022-06-16 MED ORDER — SODIUM CHLORIDE 0.9 % IV BOLUS
500.0000 mL | Freq: Once | INTRAVENOUS | Status: AC
  Administered 2022-06-16: 500 mL via INTRAVENOUS

## 2022-06-16 MED ORDER — DIPHENHYDRAMINE HCL 25 MG PO CAPS
25.0000 mg | ORAL_CAPSULE | Freq: Once | ORAL | Status: AC
  Administered 2022-06-16: 25 mg via ORAL
  Filled 2022-06-16: qty 1

## 2022-06-16 MED ORDER — PANTOPRAZOLE SODIUM 40 MG IV SOLR
40.0000 mg | Freq: Once | INTRAVENOUS | Status: AC
  Administered 2022-06-16: 40 mg via INTRAVENOUS
  Filled 2022-06-16: qty 10

## 2022-06-16 MED ORDER — ONDANSETRON 4 MG PO TBDP
4.0000 mg | ORAL_TABLET | Freq: Three times a day (TID) | ORAL | 0 refills | Status: DC | PRN
  Filled 2022-06-16: qty 20, 7d supply, fill #0

## 2022-06-16 MED ORDER — PANTOPRAZOLE SODIUM 40 MG PO TBEC
40.0000 mg | DELAYED_RELEASE_TABLET | Freq: Every day | ORAL | 1 refills | Status: DC
  Filled 2022-06-16: qty 30, 30d supply, fill #0

## 2022-06-16 NOTE — ED Triage Notes (Signed)
Pt c/o nausea, vomiting, and abdominal pain since yesterday. Pt reports some small amounts of red blood in his vomit.
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
## 2022-06-16 NOTE — Discharge Instructions (Signed)
Please cut back on your alcohol intake.  Specifically please stop drinking moonshine.  Please follow-up with your gastroenterologist was seen you for blood in your vomit before.  Please take the Protonix that I prescribed you.  Hold off on taking any ibuprofen, meloxicam, naproxen or aspirin.  Use Zofran as needed for nausea.  Given that you have had some issues with constipation and Zofran is a constipating medication please follow the bowel regimen instructions below.  GETTING TO GOOD BOWEL HEALTH. Irregular bowel habits such as constipation and diarrhea can lead to many problems over time.  Having one soft bowel movement a day is the most important way to prevent further problems.  The anorectal canal is designed to handle stretching and feces to safely manage our ability to get rid of solid waste (feces, poop, stool) out of our body.  BUT, hard constipated stools can act like ripping concrete bricks and diarrhea can be a burning fire to this very sensitive area of our body, causing inflamed hemorrhoids, anal fissures, increasing risk is perirectal abscesses, abdominal pain/bloating, an making irritable bowel worse.     The goal: ONE SOFT BOWEL MOVEMENT A DAY!  To have soft, regular bowel movements:  Drink at least 8 tall glasses of water a day.   Take plenty of fiber.  Fiber is the undigested part of plant food that passes into the colon, acting s "natures broom" to encourage bowel motility and movement.  Fiber can absorb and hold large amounts of water. This results in a larger, bulkier stool, which is soft and easier to pass. Work gradually over several weeks up to 6 servings a day of fiber (25g a day even more if needed) in the form of: Vegetables -- Root (potatoes, carrots, turnips), leafy green (lettuce, salad greens, celery, spinach), or cooked high residue (cabbage, broccoli, etc) Fruit -- Fresh (unpeeled skin & pulp), Dried (prunes, apricots, cherries, etc ),  or stewed ( applesauce)  Whole  grain breads, pasta, etc (whole wheat)  Bran cereals  Bulking Agents -- This type of water-retaining fiber generally is easily obtained each day by one of the following:  Psyllium bran -- The psyllium plant is remarkable because its ground seeds can retain so much water. This product is available as Metamucil, Konsyl, Effersyllium, Per Diem Fiber, or the less expensive generic preparation in drug and health food stores. Although labeled a laxative, it really is not a laxative.  Methylcellulose -- This is another fiber derived from wood which also retains water. It is available as Citrucel. Polyethylene Glycol - and "artificial" fiber commonly called Miralax or Glycolax.  It is helpful for people with gassy or bloated feelings with regular fiber Flax Seed - a less gassy fiber than psyllium No reading or other relaxing activity while on the toilet. If bowel movements take longer than 5 minutes, you are too constipated AVOID CONSTIPATION.  High fiber and water intake usually takes care of this.  Sometimes a laxative is needed to stimulate more frequent bowel movements, but  Laxatives are not a good long-term solution as it can wear the colon out. Osmotics (Milk of Magnesia, Fleets phosphosoda, Magnesium citrate, MiraLax, GoLytely) are safer than  Stimulants (Senokot, Castor Oil, Dulcolax, Ex Lax)    Do not take laxatives for more than 7days in a row.  IF SEVERELY CONSTIPATED, try a Bowel Retraining Program: Do not use laxatives.  Eat a diet high in roughage, such as bran cereals and leafy vegetables.  Drink six (6) ounces  of prune or apricot juice each morning.  Eat two (2) large servings of stewed fruit each day.  Take one (1) heaping tablespoon of a psyllium-based bulking agent twice a day. Use sugar-free sweetener when possible to avoid excessive calories.  Eat a normal breakfast.  Set aside 15 minutes after breakfast to sit on the toilet, but do not strain to have a bowel movement.  If you do  not have a bowel movement by the third day, use an enema and repeat the above steps.  Controlling diarrhea Switch to liquids and simpler foods for a few days to avoid stressing your intestines further. Avoid dairy products (especially milk & ice cream) for a short time.  The intestines often can lose the ability to digest lactose when stressed. Avoid foods that cause gassiness or bloating.  Typical foods include beans and other legumes, cabbage, broccoli, and dairy foods.  Every person has some sensitivity to other foods, so listen to our body and avoid those foods that trigger problems for you. Adding fiber (Citrucel, Metamucil, psyllium, Miralax) gradually can help thicken stools by absorbing excess fluid and retrain the intestines to act more normally.  Slowly increase the dose over a few weeks.  Too much fiber too soon can backfire and cause cramping & bloating. Probiotics (such as active yogurt, Align, etc) may help repopulate the intestines and colon with normal bacteria and calm down a sensitive digestive tract.  Most studies show it to be of mild help, though, and such products can be costly. Medicines: Bismuth subsalicylate (ex. Kayopectate, Pepto Bismol) every 30 minutes for up to 6 doses can help control diarrhea.  Avoid if pregnant. Loperamide (Immodium) can slow down diarrhea.  Start with two tablets (4mg  total) first and then try one tablet every 6 hours.  Avoid if you are having fevers or severe pain.  If you are not better or start feeling worse, stop all medicines and call your doctor for advice Call your doctor if you are getting worse or not better.  Sometimes further testing (cultures, endoscopy, X-ray studies, bloodwork, etc) may be needed to help diagnose and treat the cause of the diarrhea.  Managing Pain  Pain after surgery or related to activity is often due to strain/injury to muscle, tendon, nerves and/or incisions.  This pain is usually short-term and will improve in a few  months.   Many people find it helpful to do the following things TOGETHER to help speed the process of healing and to get back to regular activity more quickly:  Avoid heavy physical activity  no lifting greater than 20 pounds Do not "push through" the pain.  Listen to your body and avoid positions and maneuvers than reproduce the pain Walking is okay as tolerated, but go slowly and stop when getting sore.  Remember: If it hurts to do it, then don't do it! Take Anti-inflammatory medication  Take with food/snack around the clock for 1-2 weeks This helps the muscle and nerve tissues become less irritable and calm down faster Choose ONE of the following over-the-counter medications: Naproxen 220mg  tabs (ex. Aleve) 1-2 pills twice a day  Ibuprofen 200mg  tabs (ex. Advil, Motrin) 3-4 pills with every meal and just before bedtime Acetaminophen 500mg  tabs (Tylenol) 1-2 pills with every meal and just before bedtime Use a Heating pad or Ice/Cold Pack 4-6 times a day May use warm bath/hottub  or showers Try Gentle Massage and/or Stretching  at the area of pain many times a day stop if  you feel pain - do not overdo it  Try these steps together to help you body heal faster and avoid making things get worse.  Doing just one of these things may not be enough.    If you are not getting better after two weeks or are noticing you are getting worse, contact our office for further advice; we may need to re-evaluate you & see what other things we can do to help.

## 2022-06-16 NOTE — ED Notes (Signed)
Pt given fluid and had no vomiting or belief he was going to spit anything up... Few mins later, he felt like he was getting nauseated. Provider aware.

## 2022-07-01 ENCOUNTER — Ambulatory Visit (INDEPENDENT_AMBULATORY_CARE_PROVIDER_SITE_OTHER): Payer: BC Managed Care – PPO | Admitting: Orthopedic Surgery

## 2022-07-01 ENCOUNTER — Encounter: Payer: Self-pay | Admitting: Orthopedic Surgery

## 2022-07-01 ENCOUNTER — Encounter: Payer: Self-pay | Admitting: Adult Health

## 2022-07-01 ENCOUNTER — Other Ambulatory Visit (HOSPITAL_COMMUNITY): Payer: Self-pay

## 2022-07-01 ENCOUNTER — Ambulatory Visit: Payer: Self-pay

## 2022-07-01 ENCOUNTER — Ambulatory Visit (INDEPENDENT_AMBULATORY_CARE_PROVIDER_SITE_OTHER): Payer: BC Managed Care – PPO | Admitting: Adult Health

## 2022-07-01 VITALS — BP 113/68 | HR 80 | Ht 66.0 in | Wt 175.0 lb

## 2022-07-01 DIAGNOSIS — G8929 Other chronic pain: Secondary | ICD-10-CM | POA: Diagnosis not present

## 2022-07-01 DIAGNOSIS — M545 Low back pain, unspecified: Secondary | ICD-10-CM

## 2022-07-01 DIAGNOSIS — K920 Hematemesis: Secondary | ICD-10-CM

## 2022-07-01 MED ORDER — SUCRALFATE 1 G PO TABS
1.0000 g | ORAL_TABLET | Freq: Three times a day (TID) | ORAL | 0 refills | Status: DC
  Filled 2022-07-01: qty 120, 30d supply, fill #0

## 2022-07-01 MED ORDER — PANTOPRAZOLE SODIUM 40 MG PO TBEC
40.0000 mg | DELAYED_RELEASE_TABLET | Freq: Every day | ORAL | 1 refills | Status: DC
  Filled 2022-07-01 – 2022-07-15 (×2): qty 90, 90d supply, fill #0

## 2022-07-01 NOTE — Progress Notes (Signed)
Subjective:    Patient ID: Brett Henry, male    DOB: 19-Oct-1990, 31 y.o.   MRN: [id number]  HPI  31 year old male who  has a past medical history of Asthma, Back pain, Depression, and GERD (gastroesophageal reflux disease).  Was recently seen in the ER - two weeks ago with complaints of epigastric abdomina pain. He reported that the day prior he had several episodes of non bilious but slightly bloody emesis.   He had not been using NSAIDS daily and had run out of his prescription for PPI.   His physical exam showed some mild epigastric tenderness with deep palpation but otherwise unremarkable.   He was discharged on Protonix 40 mg daily and zofran.   He has an appointment with GI in roughly 3 weeks.   Today he reports that he has been symptom free since restarting his PPI.     Review of Systems See HPI   Past Medical History:  Diagnosis Date   Asthma    Back pain    Depression    GERD (gastroesophageal reflux disease)     Social History   Socioeconomic History   Marital status: Married    Spouse name: Not on file   Number of children: 4   Years of education: Not on file   Highest education level: Not on file  Occupational History   Occupation: Warehouse selector  Tobacco Use   Smoking status: Former    Packs/day: 1.00    Years: 10.00    Total pack years: 10.00    Types: Cigarettes   Smokeless tobacco: Never  Vaping Use   Vaping Use: Never used  Substance and Sexual Activity   Alcohol use: Yes    Comment: 1 per day   Drug use: No   Sexual activity: Yes  Other Topics Concern   Not on file  Social History Narrative   Not currently working    Social Determinants of Corporate investment banker Strain: Not on file  Food Insecurity: Not on file  Transportation Needs: Not on file  Physical Activity: Not on file  Stress: Not on file  Social Connections: Not on file  Intimate Partner Violence: Not on file    Past Surgical History:  Procedure  Laterality Date   EYE SURGERY Left    age 89     Family History  Problem Relation Age of Onset   Asthma Mother    Gout Father    Diabetes Maternal Grandmother     Allergies  Allergen Reactions   Tylenol [Acetaminophen] Anaphylaxis   Peanut-Containing Drug Products Hives   Shellfish Allergy Hives    Current Outpatient Medications on File Prior to Visit  Medication Sig Dispense Refill   albuterol (VENTOLIN HFA) 108 (90 Base) MCG/ACT inhaler Inhale 2 puffs into the lungs every 6 (six) hours as needed for shortness of breath and wheezing. 18 g 0   Cholecalciferol (VITAMIN D-3) 125 MCG (5000 UT) TABS Take 1 tablet by mouth daily. 90 tablet 3   lidocaine (XYLOCAINE) 2 % solution Use as directed 15 mLs in the mouth or throat every 6 (six) hours as needed for mouth pain. 200 mL 0   naproxen (NAPROSYN) 500 MG tablet Take 1 tablet (500 mg total) by mouth 2 (two) times daily. 30 tablet 0   omeprazole (PRILOSEC) 20 MG capsule Take 20 mg by mouth daily.     ondansetron (ZOFRAN-ODT) 4 MG disintegrating tablet Take 1 tablet (4 mg total) by  mouth every 8 (eight) hours as needed for nausea or vomiting. 20 tablet 0   pantoprazole (PROTONIX) 40 MG tablet Take 1 tablet (40 mg total) by mouth daily. 30 tablet 1   tiZANidine (ZANAFLEX) 2 MG tablet Take 1 tablet (2 mg total) by mouth every 6 (six) hours as needed for muscle spasms. 30 tablet 0   sucralfate (CARAFATE) 1 g tablet Take 1 tablet (1 g total) by mouth 4 (four) times daily -  with meals and at bedtime. 120 tablet 0   No current facility-administered medications on file prior to visit.    BP 110/70   Pulse 77   Temp (!) 97.5 F (36.4 C) (Oral)   Ht 5\' 6"  (1.676 m)   Wt 157 lb (71.2 kg)   SpO2 100%   BMI 25.34 kg/m       Objective:   Physical Exam Vitals and nursing note reviewed.  Constitutional:      Appearance: Normal appearance.  Cardiovascular:     Rate and Rhythm: Normal rate and regular rhythm.     Pulses: Normal pulses.      Heart sounds: Normal heart sounds.  Pulmonary:     Effort: Pulmonary effort is normal.     Breath sounds: Normal breath sounds.  Abdominal:     General: Abdomen is flat. Bowel sounds are normal.     Palpations: Abdomen is soft.     Tenderness: There is abdominal tenderness (mild with deep palpation).  Neurological:     Mental Status: He is alert.        Assessment & Plan:   1. Hematemesis with nausea - Will refill Protonix and Carafate for him  - Follow up with GI as directed  - pantoprazole (PROTONIX) 40 MG tablet; Take 1 tablet (40 mg total) by mouth daily.  Dispense: 90 tablet; Refill: 1 - sucralfate (CARAFATE) 1 g tablet; Take 1 tablet (1 g total) by mouth 4 (four) times daily -  with meals and at bedtime.  Dispense: 120 tablet; Refill: 0   , NP
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
## 2022-07-08 ENCOUNTER — Ambulatory Visit: Payer: BC Managed Care – PPO | Admitting: Sports Medicine

## 2022-07-16 ENCOUNTER — Other Ambulatory Visit (HOSPITAL_COMMUNITY): Payer: Self-pay

## 2022-07-24 ENCOUNTER — Ambulatory Visit (INDEPENDENT_AMBULATORY_CARE_PROVIDER_SITE_OTHER): Payer: BC Managed Care – PPO | Admitting: Nurse Practitioner

## 2022-07-24 ENCOUNTER — Other Ambulatory Visit (HOSPITAL_COMMUNITY): Payer: Self-pay

## 2022-07-24 ENCOUNTER — Encounter: Payer: Self-pay | Admitting: Nurse Practitioner

## 2022-07-24 VITALS — BP 120/64 | HR 87 | Ht 66.0 in | Wt 157.0 lb

## 2022-07-24 DIAGNOSIS — K219 Gastro-esophageal reflux disease without esophagitis: Secondary | ICD-10-CM

## 2022-07-24 DIAGNOSIS — R112 Nausea with vomiting, unspecified: Secondary | ICD-10-CM

## 2022-07-24 DIAGNOSIS — R1010 Upper abdominal pain, unspecified: Secondary | ICD-10-CM | POA: Diagnosis not present

## 2022-07-24 DIAGNOSIS — K869 Disease of pancreas, unspecified: Secondary | ICD-10-CM | POA: Diagnosis not present

## 2022-07-24 DIAGNOSIS — K920 Hematemesis: Secondary | ICD-10-CM | POA: Diagnosis not present

## 2022-07-24 MED ORDER — SUCRALFATE 1 G PO TABS
1.0000 g | ORAL_TABLET | Freq: Three times a day (TID) | ORAL | 1 refills | Status: DC
  Filled 2022-07-24: qty 120, 30d supply, fill #0

## 2022-07-24 MED ORDER — PANTOPRAZOLE SODIUM 40 MG PO TBEC
40.0000 mg | DELAYED_RELEASE_TABLET | Freq: Two times a day (BID) | ORAL | 1 refills | Status: DC
  Filled 2022-07-24 – 2022-11-18 (×3): qty 60, 30d supply, fill #0

## 2022-07-24 NOTE — Progress Notes (Signed)
Assessment    Patient profile:  Brett Henry is a 31 y.o. male known to Dr. Ardis Hughs with a past medical history of chronic abdominal pain, pancreatic lesion, asthma, THC use. See PMH /PSH for additional history  # Chronic generalized upper abdominal pain radiating through to back with occasional nausea / vomiting. Eating exacerbates the pain. EGD and CT scan in January 2023 without findings to explain symptoms. Plan at that time was for HIDA scan which was not done for unclear reasons. Recent ED visit and CBC, liver chemistries and lipase were normal. Etiology of persistent symptoms not yet clear. Biliary disease ? Functional ?   # GERD. Persistent pyrosis despite BID PPI. Carafate helps.   # Hematemesis x1 episode several weeks ago,  seems low volume.  Esophagitis? Mallory Weiss tear? Hgb in ED was normal. EGD in January 2023 ( for hematemesis ) with findings of only mild gastritis.    # Small pancreatic lesion on MRI Feb 2023 showing a well-circumscribed 5 mm cystic lesion in the head of the pancreas without suspicious MRI features.   Plan   Will remove naproxen from list ( hasn't taken in a year) Schedule for HIDA scan as previously planned when he was undergoing evaluation for these same symptoms in January 2023  Will hold off on an EGD for now as it would probably be low yield since he had one less than a year ago for same symptoms.  Continue BID pantoprazole.  Discussed anti-reflux measures such as avoidance of late meals / bedtime snacks, HOB elevation (or use of wedge pillow), and avoidance of trigger foods and caffeine.  Will refill Carafate ac and HS.  Recommended discontinuation of THC Our office will contact him about date / time for your next MRI to evaluate the pancreas. The MRI will need to be done sometime in February 2024   HPI    Chief complaint: abdominal pain, nausea and vomiting   Brett Henry was evaluated by Dr. Ardis Hughs in January of this year for periumbilical  pain and low volume hematemesis.  Subsequently an EGD was unremarkable except for mild nonspecific gastritis.  Gastric biopsies were negative for H. Pylori.  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  MRI in Feb 2023 done to evaluate pancreatic lesion showed a well-circumscribed 5 mm cystic lesion in the head of the pancreas without suspicious MRI features  For further evaluation of abdominal pain Dr. Ardis Hughs recommended a HIDA scan.  It was scheduled but not done for unclear reasons.  Patient has not been seen in our office since.  He was in the ED 06/16/2022 for evaluation of epigastric pain, bloody emesis.  He does have a history of EtOH use consuming 1/5 moonshine over the weekend every weekend.    Labs in ED unremarkable. CBC, CMP, lipase normal.   Interval History:  Brett Henry has continued to have generalized upper abdominal pain radiating through to his back. He has been having the pain since we saw him in January. The pain is stabbing and also burning. Eating makes the pain worse but bending and twisting does as well.  Episodes can last about 30 minutes. Laying down flat helps. He also continues to have intermittent nausea / vomiting associated with the pain. Generally,  emesis contains undigested food. Last week he vomited light red blood after two prior episodes of vomiting. No blood in stool   Brett Henry smokes  THC every other weekend. He hasn't had ANY Etoh since ED visit a month ago.   Naprosyn on home med list, will remove it as hasn't taken it in a year.  He takes protonix twice daily but still has frequent heartburn, especially if he eats spicy foods. He was given carafate in ED and does feel like it helps.    Previous GI Evaluation  09/09/2021 EGD - Mild, non-specific gastritis. Biopsied to check for H. pylori. - The examination was otherwise normal. Surgical [P], gastric antrum  and gastric body - GASTRIC ANTRAL AND OXYNTIC MUCOSA WITH NO SPECIFIC HISTOPATHOLOGIC CHANGES - HELICOBACTER PYLORI-LIKE ORGANISMS ARE NOT IDENTIFIED ON ROUTINE H&E STAIN  Labs:     Latest Ref Rng & Units 06/16/2022    9:36 AM 12/28/2021    4:21 PM 08/20/2021    2:08 PM  CBC  WBC 4.0 - 10.5 K/uL 9.5  14.4  7.5   Hemoglobin 13.0 - 17.0 g/dL 14.9  15.8  14.1   Hematocrit 39.0 - 52.0 % 44.1  45.5  41.9   Platelets 150 - 400 K/uL 183  199  175.0        Latest Ref Rng & Units 06/16/2022    9:36 AM 08/20/2021    2:08 PM 03/18/2021   11:12 AM  Hepatic Function  Total Protein 6.5 - 8.1 g/dL 7.6  7.1  7.6   Albumin 3.5 - 5.0 g/dL 4.5  4.1  4.7   AST 15 - 41 U/L _0 ALT 0 - 44 U/L _1 Alk Phosphatase 38 - 126 U/L 49  68  56   Total Bilirubin 0.3 - 1.2 mg/dL 0.4  0.5  0.7      Past Medical History:  Diagnosis Date   Asthma    Back pain    Depression    GERD (gastroesophageal reflux disease)     Past Surgical History:  Procedure Laterality Date   EYE SURGERY Left    age 60     Current Medications, Allergies, Family History and Social History were reviewed in [employer] record.     Current Outpatient Medications  Medication Sig Dispense Refill   albuterol (VENTOLIN HFA) 108 (90 Base) MCG/ACT inhaler Inhale 2 puffs into the lungs every 6 (six) hours as needed for shortness of breath and wheezing. 18 g 0   Cholecalciferol (VITAMIN D-3) 125 MCG (5000 UT) TABS Take 1 tablet by mouth daily. 90 tablet 3   lidocaine (XYLOCAINE) 2 % solution Use as directed 15 mLs in the mouth or throat every 6 (six) hours as needed for mouth pain. 200 mL 0   ondansetron (ZOFRAN-ODT) 4 MG disintegrating tablet Take 1 tablet (4 mg total) by mouth every 8 (eight) hours as needed for nausea or vomiting. 20 tablet 0   pantoprazole (PROTONIX) 40 MG tablet Take 1 tablet (40 mg total) by mouth daily. 90 tablet 1   sucralfate (CARAFATE) 1 g tablet Take 1 tablet (1 g total)  by mouth 4 (four) times daily -  with meals and at bedtime. 120 tablet 0   tiZANidine (ZANAFLEX) 2 MG tablet Take 1 tablet (2 mg total) by mouth every 6 (six) hours as needed for muscle spasms. 30 tablet 0   No current facility-administered medications for this visit.    Review of Systems: No chest pain. No shortness of breath. No urinary complaints.    Physical Exam  Wt Readings  from Last 3 Encounters:  07/01/22 175 lb 0.7 oz (79.4 kg)  07/01/22 157 lb (71.2 kg)  06/16/22 175 lb (79.4 kg)    BP 120/64   Pulse 87   Ht _0  (1.676 m)   Wt 157 lb (71.2 kg)   BMI 25.34 kg/m  Constitutional:  Generally well appearing male in no acute distress. Psychiatric: Normal mood and affect. Behavior is normal. EENT: Pupils normal.  Conjunctivae are normal. No scleral icterus. Neck supple.  Cardiovascular: Normal rate, regular rhythm.  Pulmonary/chest: Effort normal and breath sounds normal. No wheezing, rales or rhonchi. Abdominal: Soft, nondistended, nontender. Bowel sounds active throughout. There are no masses palpable. No hepatomegaly. Neurological: Alert and oriented to person place and time. Musculoskeletal: No edema Skin: Skin is warm and dry. No rashes noted.  Tye Savoy, NP  07/24/2022, 1:34 PM

## 2022-07-24 NOTE — Patient Instructions (Signed)
If you are age 32 or younger, your body mass index should be between 19-25. Your Body mass index is 25.34 kg/m. If this is out of the aformentioned range listed, please consider follow up with your Primary Care Provider.  ________________________________________________________  The [facility] providers would like to encourage you to use [facility] to communicate with providers for non-urgent requests or questions.  Due to long hold times on the telephone, sending your provider a message by [facility] may be a faster and more efficient way to get a response.  Please allow 48 business hours for a response.  Please remember that this is for non-urgent requests.  _______________________________________________________  We have sent the following medications to your pharmacy for you to pick up at your convenience:  CONTINUE: Pantoprazole twice daily before meals CONTINUE: Carafate  You have been scheduled for a HIDA scan at [facility] (1st floor) on 08-11-22. Please arrive 30 minutes prior to your scheduled appointment at 8am. Make certain not to have anything to eat or drink at least 6 hours prior to your test. Should this appointment date or time not work well for you, please call radiology scheduling at [phone].  _____________________________________________________________________ hepatobiliary (HIDA) scan is an imaging procedure used to diagnose problems in the liver, gallbladder and bile ducts. In the HIDA scan, a radioactive chemical or tracer is injected into a vein in your arm. The tracer is handled by the liver like bile. Bile is a fluid produced and excreted by your liver that helps your digestive system break down fats in the foods you eat. Bile is stored in your gallbladder and the gallbladder releases the bile when you eat a meal. A special nuclear medicine scanner (gamma camera) tracks the flow of the tracer from your liver into your gallbladder and small intestine.   During your  HIDA scan  You'll be asked to change into a hospital gown before your HIDA scan begins. Your health care team will position you on a table, usually on your back. The radioactive tracer is then injected into a vein in your arm.The tracer travels through your bloodstream to your liver, where it's taken up by the bile-producing cells. The radioactive tracer travels with the bile from your liver into your gallbladder and through your bile ducts to your small intestine.You may feel some pressure while the radioactive tracer is injected into your vein. As you lie on the table, a special gamma camera is positioned over your abdomen taking pictures of the tracer as it moves through your body. The gamma camera takes pictures continually for about an hour. You'll need to keep still during the HIDA scan. This can become uncomfortable, but you may find that you can lessen the discomfort by taking deep breaths and thinking about other things. Tell your health care team if you're uncomfortable. The radiologist will watch on a computer the progress of the radioactive tracer through your body. The HIDA scan may be stopped when the radioactive tracer is seen in the gallbladder and enters your small intestine. This typically takes about an hour. In some cases extra imaging will be performed if original images aren't satisfactory, if morphine is given to help visualize the gallbladder or if the medication CCK is given to look at the contraction of the gallbladder. This test typically takes 2 hours to complete. ________________________________________________________________________  Due to recent changes in healthcare laws, you may see the results of your imaging and laboratory studies on MyChart before your provider has had a  chance to review them.  We understand that in some cases there may be results that are confusing or concerning to you. Not all laboratory results come back in the same time frame and the provider may be  waiting for multiple results in order to interpret others.  Please give [place] 48 hours in order for your provider to thoroughly review all the results before contacting the office for clarification of your results.   Thank you for entrusting me with your care and choosing [facility].  Willette Cluster, NP

## 2022-07-27 NOTE — Progress Notes (Signed)
____________________________________________________________  Attending physician addendum:  Thank you for sending this case to me. I have reviewed the entire note and agree with the plan.   Ellenora Talton Danis, MD  ____________________________________________________________  

## 2022-08-04 ENCOUNTER — Other Ambulatory Visit (HOSPITAL_COMMUNITY): Payer: Self-pay

## 2022-08-11 ENCOUNTER — Encounter (HOSPITAL_COMMUNITY): Payer: BC Managed Care – PPO

## 2022-08-20 ENCOUNTER — Ambulatory Visit (HOSPITAL_COMMUNITY)
Admission: RE | Admit: 2022-08-20 | Discharge: 2022-08-20 | Disposition: A | Discharge status: Home / Self Care | Payer: 59 | Source: Ambulatory Visit | Attending: Nurse Practitioner | Admitting: Nurse Practitioner

## 2022-08-20 DIAGNOSIS — R1010 Upper abdominal pain, unspecified: Secondary | ICD-10-CM

## 2022-08-20 DIAGNOSIS — R112 Nausea with vomiting, unspecified: Secondary | ICD-10-CM | POA: Diagnosis present

## 2022-08-20 MED ORDER — TECHNETIUM TC 99M MEBROFENIN IV KIT
5.2000 | PACK | Freq: Once | INTRAVENOUS | Status: AC
  Administered 2022-08-20: 5.2 via INTRAVENOUS

## 2022-09-11 ENCOUNTER — Other Ambulatory Visit: Payer: Self-pay

## 2022-09-11 ENCOUNTER — Telehealth: Payer: Self-pay

## 2022-09-11 DIAGNOSIS — K862 Cyst of pancreas: Secondary | ICD-10-CM

## 2022-09-11 DIAGNOSIS — K869 Disease of pancreas, unspecified: Secondary | ICD-10-CM

## 2022-09-11 NOTE — Telephone Encounter (Signed)
Patient aware that he has been scheduled for MRI/MRCP at [facility] on 09-15-22 at 7pm.  Patient advised to arrive at 6:30pm with NPO 4 hours prior.  Patient agreed to plan and verbalized understanding.  No further questions.

## 2022-09-15 ENCOUNTER — Other Ambulatory Visit (HOSPITAL_COMMUNITY): Payer: Self-pay | Admitting: Gastroenterology

## 2022-09-15 ENCOUNTER — Ambulatory Visit (HOSPITAL_COMMUNITY): Admission: RE | Admit: 2022-09-15 | Payer: 59 | Source: Ambulatory Visit

## 2022-09-15 ENCOUNTER — Ambulatory Visit (HOSPITAL_COMMUNITY)
Admission: RE | Admit: 2022-09-15 | Discharge: 2022-09-15 | Disposition: A | Discharge status: Home / Self Care | Payer: No Typology Code available for payment source | Source: Ambulatory Visit | Attending: Gastroenterology | Admitting: Gastroenterology

## 2022-09-15 DIAGNOSIS — K862 Cyst of pancreas: Secondary | ICD-10-CM

## 2022-09-15 DIAGNOSIS — K869 Disease of pancreas, unspecified: Secondary | ICD-10-CM | POA: Diagnosis present

## 2022-09-15 MED ORDER — GADOBUTROL 1 MMOL/ML IV SOLN
7.0000 mL | Freq: Once | INTRAVENOUS | Status: AC | PRN
  Administered 2022-09-15: 7 mL via INTRAVENOUS

## 2022-09-16 ENCOUNTER — Other Ambulatory Visit (HOSPITAL_COMMUNITY): Payer: Self-pay

## 2022-09-16 ENCOUNTER — Ambulatory Visit (INDEPENDENT_AMBULATORY_CARE_PROVIDER_SITE_OTHER): Payer: 59 | Admitting: Gastroenterology

## 2022-09-16 ENCOUNTER — Encounter: Payer: Self-pay | Admitting: Gastroenterology

## 2022-09-16 VITALS — BP 104/60 | HR 64 | Ht 66.0 in | Wt 158.4 lb

## 2022-09-16 DIAGNOSIS — K862 Cyst of pancreas: Secondary | ICD-10-CM

## 2022-09-16 DIAGNOSIS — R1010 Upper abdominal pain, unspecified: Secondary | ICD-10-CM

## 2022-09-16 MED ORDER — HYOSCYAMINE SULFATE 0.125 MG SL SUBL
0.1250 mg | SUBLINGUAL_TABLET | Freq: Four times a day (QID) | SUBLINGUAL | 2 refills | Status: AC | PRN
  Filled 2022-09-16 – 2022-09-29 (×2): qty 30, 8d supply, fill #0

## 2022-09-16 NOTE — Progress Notes (Signed)
[facility] Progress Note  Chief Complaint: Abdominal pain and vomiting  Subjective  History: Summary of pertinent GI issues: Initially seen in clinic January 2023 for abdominal pain, vomiting and hematemesis.  Upper endoscopy by Dr. Ardis Hughs shortly afterward essentially unrevealing.  Biopsies negative for H. pylori.  CT abdomen that showed a small pancreatic cyst, plans were for MRI abdomen/MRCP and HIDA scan.  Those were not subsequently done. Seen in office 07/24/2022 by APP for ongoing symptoms, test as above are scheduled.  [name] says that after his endoscopy testing last year things seem to improve for unclear reasons.  He will episodically get bandlike upper abdominal sharp pain without associated symptoms.  Bowel habits are generally regular, though with episodes of either loose stool or constipation.  Denies rectal bleeding.  No clear triggers for symptoms.  Every day smoker, no alcohol and no marijuana (quit use of both).  ROS: Cardiovascular:  no chest pain Respiratory: no dyspnea Weight stable Still gets breakthrough heartburn despite his current acid suppression medicine The patient's Past Medical, Family and Social History were reviewed and are on file in the EMR.  Objective:  Med list reviewed  Current Outpatient Medications:    albuterol (VENTOLIN HFA) 108 (90 Base) MCG/ACT inhaler, Inhale 2 puffs into the lungs every 6 (six) hours as needed for shortness of breath and wheezing., Disp: 18 g, Rfl: 0   Cholecalciferol (VITAMIN D-3) 125 MCG (5000 UT) TABS, Take 1 tablet by mouth daily., Disp: 90 tablet, Rfl: 3   hyoscyamine (LEVSIN SL) 0.125 MG SL tablet, Place 1 tablet (0.125 mg total) under the tongue every 6 (six) hours as needed., Disp: 30 tablet, Rfl: 2   lidocaine (XYLOCAINE) 2 % solution, Use as directed 15 mLs in the mouth or throat every 6 (six) hours as needed for mouth pain., Disp: 200 mL, Rfl: 0   naproxen (NAPROSYN) 500 MG tablet, Take 1 tablet (500 mg  total) by mouth 2 (two) times daily., Disp: 30 tablet, Rfl: 0   ondansetron (ZOFRAN-ODT) 4 MG disintegrating tablet, Take 1 tablet (4 mg total) by mouth every 8 (eight) hours as needed for nausea or vomiting., Disp: 20 tablet, Rfl: 0   pantoprazole (PROTONIX) 40 MG tablet, Take 1 tablet (40 mg total) by mouth 2 (two) times daily., Disp: 60 tablet, Rfl: 1   sucralfate (CARAFATE) 1 g tablet, Take 1 tablet (1 g total) by mouth 4 (four) times daily - after meals and at bedtime., Disp: 120 tablet, Rfl: 1   tiZANidine (ZANAFLEX) 2 MG tablet, Take 1 tablet (2 mg total) by mouth every 6 (six) hours as needed for muscle spasms., Disp: 30 tablet, Rfl: 0   Vital signs in last 24 hrs: Vitals:   09/16/22 1424  BP: 104/60  Pulse: 64   Wt Readings from Last 3 Encounters:  09/16/22 158 lb 6.4 oz (71.8 kg)  07/24/22 157 lb (71.2 kg)  07/01/22 175 lb 0.7 oz (79.4 kg)    Physical Exam  Well-appearing, normal vocal quality Cardiac: Regular without appreciable murmur,  no peripheral edema Pulm: clear to auscultation bilaterally, normal RR and effort noted Abdomen: soft, no tenderness, with active bowel sounds. No guarding or palpable hepatosplenomegaly.  No distention or bruit Skin; warm and dry, no jaundice or rash  Labs:   ___________________________________________ Radiologic studies:  CLINICAL DATA:  Abdominal pain common nausea and vomiting.   EXAM: NUCLEAR MEDICINE HEPATOBILIARY IMAGING WITH GALLBLADDER EF   TECHNIQUE: Sequential images of the abdomen were obtained  out to 60 minutes following intravenous administration of radiopharmaceutical. After oral ingestion of Ensure, gallbladder ejection fraction was determined. At 60 min, normal ejection fraction is greater than 33%.   RADIOPHARMACEUTICALS:  5.2 mCi Tc-87m  Choletec IV   COMPARISON:  MRI September 20, 2021   FINDINGS: Prompt uptake and biliary excretion of activity by the liver is seen. Gallbladder activity is visualized,  consistent with patency of cystic duct. Biliary activity passes into small bowel, consistent with patent common bile duct.   Calculated gallbladder ejection fraction is 64%. (Normal gallbladder ejection fraction with Ensure is greater than 33%.)   IMPRESSION: 1.  Patent cystic and common bile ducts.   2.  Normal gallbladder ejection fraction.     Electronically Signed   By: Dahlia Bailiff M.D.   On: 08/20/2022 12:11 ___________________  CLINICAL DATA:  Pancreatic cystic lesion   EXAM: MRI ABDOMEN WITHOUT AND WITH CONTRAST (INCLUDING MRCP)   TECHNIQUE: Multiplanar multisequence MR imaging of the abdomen was performed both before and after the administration of intravenous contrast. Heavily T2-weighted images of the biliary and pancreatic ducts were obtained, and three-dimensional MRCP images were rendered by post processing.   CONTRAST:  25mL GADAVIST GADOBUTROL 1 MMOL/ML IV SOLN   COMPARISON:  09/20/2021   FINDINGS: Lower chest: No acute abnormality.   Hepatobiliary: No solid liver abnormality is seen. No gallstones, gallbladder wall thickening, or biliary dilatation.   Pancreas: Unchanged 0.6 cm fluid signal cystic lesion of the pancreatic head (series 3, image 21). There is again no solid component or contrast enhancement. No pancreatic ductal dilatation or surrounding inflammatory changes.   Spleen: Normal in size without significant abnormality.   Adrenals/Urinary Tract: Adrenal glands are unremarkable. Kidneys are normal, without renal calculi, solid lesion, or hydronephrosis.   Stomach/Bowel: Stomach is within normal limits. No evidence of bowel wall thickening, distention, or inflammatory changes.   Vascular/Lymphatic: No significant vascular findings are present. No enlarged abdominal lymph nodes.   Other: No abdominal wall hernia or abnormality. No ascites.   Musculoskeletal: No acute or significant osseous findings.   IMPRESSION: Unchanged 0.6 cm  fluid signal cystic lesion of the pancreatic head. There is again no solid component or contrast enhancement. No pancreatic ductal dilatation or surrounding inflammatory changes. 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 As there is no observed increased risk of malignancy for such lesions smaller than 2 cm, no further follow-up or characterization is required.     Electronically Signed   By: Delanna Ahmadi M.D.   On: 09/15/2022 23:17     ____________________________________________ Other:   _____________________________________________ Assessment & Plan  Assessment: Encounter Diagnoses  Name Primary?   Upper abdominal pain Yes   Pancreatic cyst    Periodic sharp upper abdominal pain, negative endoscopic workup a year ago for similar symptoms.  Imaging is revealed some incidental findings, though not been the cause of his symptoms.  Normal gallbladder testing.  Small benign-appearing pancreatic cyst confirmed on MRI yesterday.  Suspect he has some functional symptoms, will give a trial of hyoscyamine.  Smoking cessation, contact primary care for assistance with that.  Nelida Meuse III

## 2022-09-16 NOTE — Patient Instructions (Signed)
_______________________________________________________  If your blood pressure at your visit was 140/90 or greater, please contact your primary care physician to follow up on this.  _______________________________________________________  If you are age 33 or older, your body mass index should be between 23-30. Your Body mass index is 25.57 kg/m. If this is out of the aforementioned range listed, please consider follow up with your Primary Care Provider.  If you are age 29 or younger, your body mass index should be between 19-25. Your Body mass index is 25.57 kg/m. If this is out of the aformentioned range listed, please consider follow up with your Primary Care Provider.   ________________________________________________________  The [facility] providers would like to encourage you to use [facility] to communicate with providers for non-urgent requests or questions.  Due to long hold times on the telephone, sending your provider a message by [facility] may be a faster and more efficient way to get a response.  Please allow 48 business hours for a response.  Please remember that this is for non-urgent requests.  _______________________________________________________  It was a pleasure to see you today!  Thank you for trusting me with your gastrointestinal care!

## 2022-09-28 ENCOUNTER — Other Ambulatory Visit (HOSPITAL_COMMUNITY): Payer: Self-pay

## 2022-09-29 ENCOUNTER — Other Ambulatory Visit (HOSPITAL_COMMUNITY): Payer: Self-pay

## 2022-09-30 ENCOUNTER — Other Ambulatory Visit (HOSPITAL_COMMUNITY): Payer: Self-pay

## 2022-10-28 ENCOUNTER — Encounter: Payer: Self-pay | Admitting: Adult Health

## 2022-10-28 ENCOUNTER — Other Ambulatory Visit (HOSPITAL_COMMUNITY): Payer: Self-pay

## 2022-10-28 ENCOUNTER — Ambulatory Visit (INDEPENDENT_AMBULATORY_CARE_PROVIDER_SITE_OTHER): Payer: 59 | Admitting: Adult Health

## 2022-10-28 VITALS — BP 110/80 | HR 73 | Temp 97.9°F | Ht 65.25 in | Wt 160.0 lb

## 2022-10-28 DIAGNOSIS — Z0000 Encounter for general adult medical examination without abnormal findings: Secondary | ICD-10-CM | POA: Diagnosis not present

## 2022-10-28 DIAGNOSIS — Z114 Encounter for screening for human immunodeficiency virus [HIV]: Secondary | ICD-10-CM

## 2022-10-28 DIAGNOSIS — M545 Low back pain, unspecified: Secondary | ICD-10-CM

## 2022-10-28 DIAGNOSIS — K2100 Gastro-esophageal reflux disease with esophagitis, without bleeding: Secondary | ICD-10-CM | POA: Diagnosis not present

## 2022-10-28 DIAGNOSIS — Z720 Tobacco use: Secondary | ICD-10-CM | POA: Diagnosis not present

## 2022-10-28 DIAGNOSIS — G8929 Other chronic pain: Secondary | ICD-10-CM

## 2022-10-28 DIAGNOSIS — Z1159 Encounter for screening for other viral diseases: Secondary | ICD-10-CM

## 2022-10-28 DIAGNOSIS — J45990 Exercise induced bronchospasm: Secondary | ICD-10-CM | POA: Diagnosis not present

## 2022-10-28 LAB — CBC
HCT: 45.7 % (ref 39.0–52.0)
Hemoglobin: 15.8 g/dL (ref 13.0–17.0)
MCHC: 34.6 g/dL (ref 30.0–36.0)
MCV: 89.4 fl (ref 78.0–100.0)
Platelets: 181 10*3/uL (ref 150.0–400.0)
RBC: 5.11 Mil/uL (ref 4.22–5.81)
RDW: 13.3 % (ref 11.5–15.5)
WBC: 8.8 10*3/uL (ref 4.0–10.5)

## 2022-10-28 LAB — COMPREHENSIVE METABOLIC PANEL
ALT: 18 U/L (ref 0–53)
AST: 18 U/L (ref 0–37)
Albumin: 4.5 g/dL (ref 3.5–5.2)
Alkaline Phosphatase: 61 U/L (ref 39–117)
BUN: 15 mg/dL (ref 6–23)
CO2: 25 mEq/L (ref 19–32)
Calcium: 9.6 mg/dL (ref 8.4–10.5)
Chloride: 103 mEq/L (ref 96–112)
Creatinine, Ser: 1.01 mg/dL (ref 0.40–1.50)
GFR: 98.07 mL/min (ref >60.00)
Glucose, Bld: 83 mg/dL (ref 70–99)
Potassium: 4 mEq/L (ref 3.5–5.1)
Sodium: 137 mEq/L (ref 135–145)
Total Bilirubin: 0.6 mg/dL (ref 0.2–1.2)
Total Protein: 8.1 g/dL (ref 6.0–8.3)

## 2022-10-28 LAB — LIPID PANEL
Cholesterol: 194 mg/dL (ref 0–200)
HDL: 48.2 mg/dL (ref >39.00)
LDL Cholesterol: 132 mg/dL — ABNORMAL HIGH (ref 0–99)
NonHDL: 145.89
Total CHOL/HDL Ratio: 4
Triglycerides: 69 mg/dL (ref 0.0–149.0)
VLDL: 13.8 mg/dL (ref 0.0–40.0)

## 2022-10-28 LAB — TSH: TSH: 0.59 u[IU]/mL (ref 0.35–5.50)

## 2022-10-28 MED ORDER — TIZANIDINE HCL 2 MG PO TABS
2.0000 mg | ORAL_TABLET | Freq: Four times a day (QID) | ORAL | 1 refills | Status: DC | PRN
  Filled 2022-10-28: qty 30, 8d supply, fill #0

## 2022-10-28 MED ORDER — ALBUTEROL SULFATE HFA 108 (90 BASE) MCG/ACT IN AERS
2.0000 | INHALATION_SPRAY | Freq: Four times a day (QID) | RESPIRATORY_TRACT | 0 refills | Status: DC | PRN
  Filled 2022-10-28: qty 6.7, 25d supply, fill #0

## 2022-10-28 MED ORDER — BUPROPION HCL ER (SR) 150 MG PO TB12
150.0000 mg | ORAL_TABLET | Freq: Two times a day (BID) | ORAL | 0 refills | Status: DC
  Filled 2022-10-28: qty 180, 90d supply, fill #0

## 2022-10-28 NOTE — Progress Notes (Signed)
Subjective:    Patient ID: Brett Henry, male    DOB: 04/07/1991, 32 y.o.   MRN: [id number]  HPI Patient presents for yearly preventative medicine examination. He is a pleasant 32 year old male who  has a past medical history of Asthma, Back pain, Depression, and GERD (gastroesophageal reflux disease).  Exercise Induced asthma - uses albuterol inhaler PRN. He denies any recent flares.   GERD/upper abdominal pain  - takes Protonix 40 mg BID and carafate PRN. Was recently seen by Gastroenterology had an MRI of the abdomen and MRCP with and without contrast done which showed unchanged 0.6 cm fluid cystic lesion on the pancreatic head otherwise normal MRI.  Gastroenterology send in hyoscyamine and reports that this has been helpful in his stomach pain.   Chronic lumbar back pain - since 2018. He works in Architect and works long hours. Has pain that he takes d tylenol for as needed. Periodically he will need tramadol and a MSK relaxer. Had a normal lumbar spine MRI in 03/2020. He is reporting worsening back pain today and he would like to go to PT   Tobacco Use - he smokes about three black and milds a day. He would like to quit.    All immunizations and health maintenance protocols were reviewed with the patient and needed orders were placed.  Appropriate screening laboratory values were ordered for the patient including screening of hyperlipidemia, renal function and hepatic function. If indicated by BPH, a PSA was ordered.  Medication reconciliation,  past medical history, social history, problem list and allergies were reviewed in detail with the patient  Goals were established with regard to weight loss, exercise, and  diet in compliance with medications Wt Readings from Last 3 Encounters:  10/28/22 160 lb (72.6 kg)  09/16/22 158 lb 6.4 oz (71.8 kg)  07/24/22 157 lb (71.2 kg)   Review of Systems  Constitutional: Negative.   HENT: Negative.    Eyes: Negative.   Respiratory:  Negative.    Cardiovascular: Negative.   Gastrointestinal: Negative.   Endocrine: Negative.   Genitourinary: Negative.   Musculoskeletal:  Positive for back pain.  Skin: Negative.   Allergic/Immunologic: Negative.   Neurological: Negative.   Hematological: Negative.   Psychiatric/Behavioral: Negative.    All other systems reviewed and are negative.  Past Medical History:  Diagnosis Date   Asthma    Back pain    Depression    GERD (gastroesophageal reflux disease)     Social History   Socioeconomic History   Marital status: Married    Spouse name: Not on file   Number of children: 4   Years of education: Not on file   Highest education level: Not on file  Occupational History   Occupation: Warehouse selector  Tobacco Use   Smoking status: Former    Packs/day: 1.00    Years: 10.00    Additional pack years: 0.00    Total pack years: 10.00    Types: Cigarettes   Smokeless tobacco: Never  Vaping Use   Vaping Use: Never used  Substance and Sexual Activity   Alcohol use: Yes    Comment: 1 per day   Drug use: No   Sexual activity: Yes  Other Topics Concern   Not on file  Social History Narrative   Not currently working    Social Determinants of Health   Financial Resource Strain: Not on file  Food Insecurity: Not on file  Transportation Needs: Not on file  Physical  Activity: Not on file  Stress: Not on file  Social Connections: Not on file  Intimate Partner Violence: Not on file    Past Surgical History:  Procedure Laterality Date   EYE SURGERY Left    age 57     Family History  Problem Relation Age of Onset   Asthma Mother    Gout Father    Diabetes Maternal Grandmother     Allergies  Allergen Reactions   Tylenol [Acetaminophen] Anaphylaxis   Peanut-Containing Drug Products Hives   Shellfish Allergy Hives    Current Outpatient Medications on File Prior to Visit  Medication Sig Dispense Refill   albuterol (VENTOLIN HFA) 108 (90 Base) MCG/ACT  inhaler Inhale 2 puffs into the lungs every 6 (six) hours as needed for shortness of breath and wheezing. 18 g 0   Cholecalciferol (VITAMIN D-3) 125 MCG (5000 UT) TABS Take 1 tablet by mouth daily. 90 tablet 3   hyoscyamine (LEVSIN SL) 0.125 MG SL tablet Place 1 tablet (0.125 mg total) under the tongue every 6 (six) hours as needed. 30 tablet 2   lidocaine (XYLOCAINE) 2 % solution Use as directed 15 mLs in the mouth or throat every 6 (six) hours as needed for mouth pain. 200 mL 0   ondansetron (ZOFRAN-ODT) 4 MG disintegrating tablet Take 1 tablet (4 mg total) by mouth every 8 (eight) hours as needed for nausea or vomiting. 20 tablet 0   pantoprazole (PROTONIX) 40 MG tablet Take 1 tablet (40 mg total) by mouth 2 (two) times daily. 60 tablet 1   sucralfate (CARAFATE) 1 g tablet Take 1 tablet (1 g total) by mouth 4 (four) times daily - after meals and at bedtime. 120 tablet 1   tiZANidine (ZANAFLEX) 2 MG tablet Take 1 tablet (2 mg total) by mouth every 6 (six) hours as needed for muscle spasms. 30 tablet 0   No current facility-administered medications on file prior to visit.    BP 110/80   Pulse 73   Temp 97.9 F (36.6 C) (Oral)   Ht 5' 5.25" (1.657 m)   Wt 160 lb (72.6 kg)   SpO2 99%   BMI 26.42 kg/m       Objective:   Physical Exam Vitals and nursing note reviewed.  Constitutional:      General: He is not in acute distress.    Appearance: Normal appearance. He is not ill-appearing.  HENT:     Head: Normocephalic and atraumatic.     Right Ear: Tympanic membrane, ear canal and external ear normal. There is no impacted cerumen.     Left Ear: Tympanic membrane, ear canal and external ear normal. There is no impacted cerumen.     Nose: Nose normal. No congestion or rhinorrhea.     Mouth/Throat:     Mouth: Mucous membranes are moist.     Pharynx: Oropharynx is clear.  Eyes:     Extraocular Movements: Extraocular movements intact.     Conjunctiva/sclera: Conjunctivae normal.      Pupils: Pupils are equal, round, and reactive to light.  Neck:     Vascular: No carotid bruit.  Cardiovascular:     Rate and Rhythm: Normal rate and regular rhythm.     Pulses: Normal pulses.     Heart sounds: No murmur heard.    No friction rub. No gallop.  Pulmonary:     Effort: Pulmonary effort is normal.     Breath sounds: Normal breath sounds.  Abdominal:  General: Abdomen is flat. Bowel sounds are normal. There is no distension.     Palpations: Abdomen is soft. There is no mass.     Tenderness: There is no abdominal tenderness. There is no guarding or rebound.     Hernia: No hernia is present.  Musculoskeletal:        General: Tenderness present. Normal range of motion.     Cervical back: Normal range of motion and neck supple.  Lymphadenopathy:     Cervical: No cervical adenopathy.  Skin:    General: Skin is warm and dry.     Capillary Refill: Capillary refill takes less than 2 seconds.  Neurological:     General: No focal deficit present.     Mental Status: He is alert and oriented to person, place, and time.  Psychiatric:        Mood and Affect: Mood normal.        Behavior: Behavior normal.        Thought Content: Thought content normal.        Judgment: Judgment normal.       Assessment & Plan:   1. Routine general medical examination at a health care facility Today patient counseled on age appropriate routine health concerns for screening and prevention, each reviewed and up to date or declined. Immunizations reviewed and up to date or declined. Labs ordered and reviewed. Risk factors for depression reviewed and negative. Hearing function and visual acuity are intact. ADLs screened and addressed as needed. Functional ability and level of safety reviewed and appropriate. Education, counseling and referrals performed based on assessed risks today. Patient provided with a copy of personalized plan for preventive services. - Follow up in one year or sooner if  needed  2. Exercise-induced asthma  - Lipid panel; Future - TSH; Future - CBC; Future - Comprehensive metabolic panel; Future - albuterol (VENTOLIN HFA) 108 (90 Base) MCG/ACT inhaler; Inhale 2 puffs into the lungs every 6 (six) hours as needed for shortness of breath and wheezing.  Dispense: 6.7 g; Refill: 0  3. Gastroesophageal reflux disease with esophagitis without hemorrhage - Doing much better. Continue with regimen from GI  - Lipid panel; Future - TSH; Future - CBC; Future - Comprehensive metabolic panel; Future  4. Tobacco use - Discussed options to help him quit including wellbutrin and chantix. He would like to try wellbutrin  - Will have him follow up in one month  - Lipid panel; Future - TSH; Future - CBC; Future - Comprehensive metabolic panel; Future - buPROPion (WELLBUTRIN SR) 150 MG 12 hr tablet; Take 1 tablet (150 mg total) by mouth 2 (two) times daily.  Dispense: 180 tablet; Refill: 0  5. Chronic bilateral low back pain without sciatica - referral to PT planced  - Lipid panel; Future - TSH; Future - CBC; Future - Comprehensive metabolic panel; Future - tiZANidine (ZANAFLEX) 2 MG tablet; Take 1 tablet (2 mg total) by mouth every 6 (six) hours as needed for muscle spasms.  Dispense: 30 tablet; Refill: 1 - Ambulatory referral to Physical Therapy  6. Need for hepatitis C screening test  - Hepatitis C antibody; Future  7. Encounter for screening for HIV  - HIV Antibody (routine testing w rflx); Future  Dorothyann Peng, NP

## 2022-10-28 NOTE — Patient Instructions (Signed)
It was great seeing you today   We will follow up with you regarding your lab work   Please let me know if you need anything   I am going to send in Wellbutrin to help you quit smoking  Follow up in about a month for a virtual visit to see how you are doing

## 2022-10-29 LAB — HIV ANTIBODY (ROUTINE TESTING W REFLEX): HIV 1&2 Ab, 4th Generation: NONREACTIVE

## 2022-10-29 LAB — HEPATITIS C ANTIBODY: Hepatitis C Ab: NONREACTIVE

## 2022-11-18 ENCOUNTER — Other Ambulatory Visit: Payer: Self-pay

## 2022-11-19 ENCOUNTER — Other Ambulatory Visit (HOSPITAL_COMMUNITY): Payer: Self-pay

## 2022-12-04 ENCOUNTER — Telehealth: Payer: Self-pay | Admitting: Adult Health

## 2022-12-04 NOTE — Telephone Encounter (Signed)
Requesting a call to discuss neurological findings from a back evaluation. Says they also sent them via fax

## 2022-12-04 NOTE — Telephone Encounter (Signed)
Please advise 

## 2022-12-08 NOTE — Telephone Encounter (Signed)
Spoke to pt and advised that we did not receive the faxed PPW. Pt stated he will reach out to PT and have them fax it again.

## 2022-12-09 NOTE — Telephone Encounter (Signed)
Spoke to PT office and they stated that pt may be suffering from cauda equina syndrome. They advised pt to speak with PCP to see what the next move should be. I also informed PT that we did not received PPW. They stated that they will send it again.  I advised pt spouse that pt may need to follow up in office but will wait on the faxed eval.

## 2022-12-09 NOTE — Telephone Encounter (Signed)
Patient notified of update  and verbalized understanding. 

## 2022-12-15 ENCOUNTER — Other Ambulatory Visit (HOSPITAL_COMMUNITY): Payer: Self-pay

## 2022-12-15 ENCOUNTER — Ambulatory Visit (INDEPENDENT_AMBULATORY_CARE_PROVIDER_SITE_OTHER): Payer: 59 | Admitting: Adult Health

## 2022-12-15 ENCOUNTER — Encounter: Payer: Self-pay | Admitting: Adult Health

## 2022-12-15 VITALS — BP 130/80 | HR 80 | Temp 97.5°F | Ht 65.25 in | Wt 166.0 lb

## 2022-12-15 DIAGNOSIS — F419 Anxiety disorder, unspecified: Secondary | ICD-10-CM

## 2022-12-15 DIAGNOSIS — M5442 Lumbago with sciatica, left side: Secondary | ICD-10-CM

## 2022-12-15 DIAGNOSIS — M5412 Radiculopathy, cervical region: Secondary | ICD-10-CM | POA: Diagnosis not present

## 2022-12-15 DIAGNOSIS — F32A Depression, unspecified: Secondary | ICD-10-CM | POA: Diagnosis not present

## 2022-12-15 MED ORDER — CITALOPRAM HYDROBROMIDE 10 MG PO TABS
10.0000 mg | ORAL_TABLET | Freq: Every day | ORAL | 3 refills | Status: DC
  Filled 2022-12-15: qty 30, 30d supply, fill #0

## 2022-12-15 NOTE — Progress Notes (Addendum)
Subjective:    Patient ID: Brett Henry, male    DOB: June 02, 1991, 32 y.o.   MRN: [id number]  HPI 32 year old male who  has a past medical history of Asthma, Back pain, Depression, and GERD (gastroesophageal reflux disease).  He presents to the office today for follow up regarding low back pain.  He has had chronic lower back pain since 2018.  He had a normal lumbar spine MRI in 2021.  About a month and a half ago he was having worsening low back pain and was referred to physical therapy.  Physical therapy sent him back to me due to symptoms concerning for cauda equina.  He reports saddle numbness, issues with bowel and bladder and persistent lower extremity paresthesia, and night pain that does not diminished with change in positions.  He has never brought up these symptoms in the past although they have been going on for a year or longer.  He reports that the saddle numbness comes and goes and is persistent for about an hour at a time, this happens about every other day.  Has further issues with bowel and bladder reports "sometimes I can go and sometimes I cannot go".  He denies incontinence.  Furthermore, over the last 1 to 2 weeks he started develop numbness to his left upper extremity.  He does report decreased grip strength in his left upper extremity.    He reports increased anxiety and depression dealing with his low back pain.  He is taking Wellbutrin 150 mg twice daily to help him quit smoking but does not feel as though it helps with his anxiety and depression   Review of Systems See HPI   Past Medical History:  Diagnosis Date   Asthma    Back pain    Depression    GERD (gastroesophageal reflux disease)     Social History   Socioeconomic History   Marital status: Married    Spouse name: Not on file   Number of children: 4   Years of education: Not on file   Highest education level: Not on file  Occupational History   Occupation: Warehouse selector  Tobacco Use    Smoking status: Former    Packs/day: 1.00    Years: 10.00    Additional pack years: 0.00    Total pack years: 10.00    Types: Cigarettes   Smokeless tobacco: Never  Vaping Use   Vaping Use: Never used  Substance and Sexual Activity   Alcohol use: Yes    Comment: 1 per day   Drug use: No   Sexual activity: Yes  Other Topics Concern   Not on file  Social History Narrative   Not currently working    Social Determinants of Health   Financial Resource Strain: Not on file  Food Insecurity: Not on file  Transportation Needs: Not on file  Physical Activity: Not on file  Stress: Not on file  Social Connections: Not on file  Intimate Partner Violence: Not on file    Past Surgical History:  Procedure Laterality Date   EYE SURGERY Left    age 20     Family History  Problem Relation Age of Onset   Asthma Mother    Gout Father    Diabetes Maternal Grandmother     Allergies  Allergen Reactions   Tylenol [Acetaminophen] Anaphylaxis   Peanut-Containing Drug Products Hives   Shellfish Allergy Hives    Current Outpatient Medications on File Prior to Visit  Medication Sig Dispense Refill   albuterol (VENTOLIN HFA) 108 (90 Base) MCG/ACT inhaler Inhale 2 puffs into the lungs every 6 (six) hours as needed for shortness of breath and wheezing. 6.7 g 0   buPROPion (WELLBUTRIN SR) 150 MG 12 hr tablet Take 1 tablet (150 mg total) by mouth 2 (two) times daily. 180 tablet 0   Cholecalciferol (VITAMIN D-3) 125 MCG (5000 UT) TABS Take 1 tablet by mouth daily. 90 tablet 3   hyoscyamine (LEVSIN SL) 0.125 MG SL tablet Place 1 tablet (0.125 mg total) under the tongue every 6 (six) hours as needed. 30 tablet 2   lidocaine (XYLOCAINE) 2 % solution Use as directed 15 mLs in the mouth or throat every 6 (six) hours as needed for mouth pain. 200 mL 0   ondansetron (ZOFRAN-ODT) 4 MG disintegrating tablet Take 1 tablet (4 mg total) by mouth every 8 (eight) hours as needed for nausea or vomiting. 20  tablet 0   pantoprazole (PROTONIX) 40 MG tablet Take 1 tablet (40 mg total) by mouth 2 (two) times daily. 60 tablet 1   sucralfate (CARAFATE) 1 g tablet Take 1 tablet (1 g total) by mouth 4 (four) times daily - after meals and at bedtime. 120 tablet 1   tiZANidine (ZANAFLEX) 2 MG tablet Take 1 tablet (2 mg total) by mouth every 6 (six) hours as needed for muscle spasms. 30 tablet 1   No current facility-administered medications on file prior to visit.    BP 130/80   Pulse 80   Temp (!) 97.5 F (36.4 C) (Oral)   Ht 5' 5.25" (1.657 m)   Wt 166 lb (75.3 kg)   SpO2 98%   BMI 27.41 kg/m       Objective:   Physical Exam Constitutional:      Appearance: Normal appearance.  Cardiovascular:     Rate and Rhythm: Normal rate and regular rhythm.     Pulses: Normal pulses.     Heart sounds: Normal heart sounds.  Pulmonary:     Effort: Pulmonary effort is normal.     Breath sounds: Normal breath sounds.  Musculoskeletal:        General: Normal range of motion.  Skin:    General: Skin is warm and dry.     Capillary Refill: Capillary refill takes less than 2 seconds.  Neurological:     General: No focal deficit present.     Mental Status: He is alert and oriented to person, place, and time.     Sensory: Sensory deficit present.     Motor: Motor function is intact.     Comments: Sensation more intact on right upper extremity and right lower extremity than on the left upper and lower extremity.  5/5 grip strength in RUE 4/5 grip strength in LUE  Psychiatric:        Mood and Affect: Mood normal.        Behavior: Behavior normal.        Thought Content: Thought content normal.       Assessment & Plan:  1. Acute bilateral low back pain with left-sided sciatica -Concern for nerve entrapment, MS, and cauda equina.  Will order MRI of spine to r/o cauda equina  - Consider referral to Neurosurgery  -- MR Lumbar Spine Wo Contrast; Future  - Basic Metabolic Panel  2. Anxiety and  depression PHQ 9 score = 20  GAD 7 = 19 - Add Celexa to regimen  - Follow up  in  one month  - citalopram (CELEXA) 10 MG tablet; Take 1 tablet (10 mg total) by mouth daily.  Dispense: 30 tablet; Refill: 3  3. Cervical radiculopathy - Due to symptoms will order MR cervical spine  - MR Cervical Spine Wo Contrast; Future  Shirline Frees, NP

## 2022-12-16 LAB — BASIC METABOLIC PANEL
BUN: 13 mg/dL (ref 6–23)
CO2: 25 mEq/L (ref 19–32)
Calcium: 9.3 mg/dL (ref 8.4–10.5)
Chloride: 107 mEq/L (ref 96–112)
Creatinine, Ser: 1.1 mg/dL (ref 0.40–1.50)
GFR: 88.44 mL/min (ref >60.00)
Glucose, Bld: 81 mg/dL (ref 70–99)
Potassium: 3.8 mEq/L (ref 3.5–5.1)
Sodium: 140 mEq/L (ref 135–145)

## 2022-12-17 ENCOUNTER — Telehealth: Payer: Self-pay | Admitting: Adult Health

## 2022-12-17 NOTE — Telephone Encounter (Signed)
Patient dropped off document FMLA, to be filled out by provider. Patient requested to send it via Call Patient to pick up within 5-days. Document is located in providers tray at front office.Please advise at Mobile [phone] (mobile)

## 2022-12-22 NOTE — Telephone Encounter (Signed)
Pt called to FU on this request.  Pt would like to know when can he come in to pick up his FMLA papers.  Pt was assured that if/when NP signed off on paperwork, he would be called by CMA.

## 2022-12-24 ENCOUNTER — Other Ambulatory Visit (HOSPITAL_BASED_OUTPATIENT_CLINIC_OR_DEPARTMENT_OTHER): Payer: 59

## 2022-12-24 NOTE — Telephone Encounter (Signed)
Patient notified of update  and verbalized understanding. 

## 2022-12-24 NOTE — Telephone Encounter (Signed)
Pt notified that we are still working on PPW. Pt verbalized understanding.

## 2022-12-25 ENCOUNTER — Ambulatory Visit (HOSPITAL_BASED_OUTPATIENT_CLINIC_OR_DEPARTMENT_OTHER): Payer: 59

## 2023-01-08 ENCOUNTER — Ambulatory Visit (HOSPITAL_BASED_OUTPATIENT_CLINIC_OR_DEPARTMENT_OTHER)
Admission: RE | Admit: 2023-01-08 | Discharge: 2023-01-08 | Disposition: A | Discharge status: Home / Self Care | Payer: 59 | Source: Ambulatory Visit | Attending: Adult Health | Admitting: Adult Health

## 2023-01-08 DIAGNOSIS — M5442 Lumbago with sciatica, left side: Secondary | ICD-10-CM | POA: Insufficient documentation

## 2023-01-08 DIAGNOSIS — M5412 Radiculopathy, cervical region: Secondary | ICD-10-CM | POA: Insufficient documentation

## 2023-01-11 ENCOUNTER — Encounter: Payer: Self-pay | Admitting: Adult Health

## 2023-01-11 NOTE — Telephone Encounter (Signed)
Pt also need work note to return back to work without restrictions

## 2023-01-12 ENCOUNTER — Other Ambulatory Visit: Payer: Self-pay | Admitting: Adult Health

## 2023-01-12 ENCOUNTER — Encounter: Payer: Self-pay | Admitting: Adult Health

## 2023-01-13 ENCOUNTER — Encounter: Payer: Self-pay | Admitting: Adult Health

## 2023-03-03 ENCOUNTER — Other Ambulatory Visit (HOSPITAL_COMMUNITY): Payer: Self-pay

## 2023-04-23 ENCOUNTER — Other Ambulatory Visit (HOSPITAL_COMMUNITY): Payer: Self-pay

## 2023-04-23 ENCOUNTER — Encounter (HOSPITAL_COMMUNITY): Payer: Self-pay

## 2023-04-23 ENCOUNTER — Ambulatory Visit (HOSPITAL_COMMUNITY)
Admission: EM | Admit: 2023-04-23 | Discharge: 2023-04-23 | Disposition: A | Discharge status: Home / Self Care | Payer: No Typology Code available for payment source | Attending: Emergency Medicine | Admitting: Emergency Medicine

## 2023-04-23 DIAGNOSIS — G8929 Other chronic pain: Secondary | ICD-10-CM | POA: Diagnosis not present

## 2023-04-23 DIAGNOSIS — M5442 Lumbago with sciatica, left side: Secondary | ICD-10-CM | POA: Diagnosis not present

## 2023-04-23 MED ORDER — DEXAMETHASONE SODIUM PHOSPHATE 10 MG/ML IJ SOLN
INTRAMUSCULAR | Status: AC
  Filled 2023-04-23: qty 1

## 2023-04-23 MED ORDER — NAPROXEN 500 MG PO TABS
500.0000 mg | ORAL_TABLET | Freq: Two times a day (BID) | ORAL | 0 refills | Status: DC
  Filled 2023-04-23: qty 30, 15d supply, fill #0

## 2023-04-23 MED ORDER — DEXAMETHASONE SODIUM PHOSPHATE 10 MG/ML IJ SOLN
10.0000 mg | Freq: Once | INTRAMUSCULAR | Status: AC
  Administered 2023-04-23: 10 mg via INTRAMUSCULAR

## 2023-04-23 NOTE — ED Triage Notes (Signed)
Pt states lower back pain on and off for a long time.  States this episode started 3 days ago.  States he has not been taking anything at home for the pain.

## 2023-04-23 NOTE — Discharge Instructions (Addendum)
We have given you an IM steroid injection in clinic for your back pain, please take the anti-inflammatories as needed.  He can take them with food, stop them immediately if you notice any blood in your stool.  Continue with physical therapy.  Ensure you are using proper lifting posture at work and proper form of physical therapy.  Seek immediate care if you develop any inner leg numbness, incontinence, trouble walking, or any new concerning symptoms.

## 2023-04-23 NOTE — ED Provider Notes (Signed)
[name]    CSN: [id number] Arrival date & time: 04/23/23  9629      History   Chief Complaint Chief Complaint  Patient presents with   Back Pain    HPI Brett Henry is a 32 y.o. male.   Patient presents to clinic for low back pain that radiates down his left leg.  Pain is chronic, but thinks it was flared up by physical therapy on Wednesday.  This episode of pain started after PT.   Denies any new exercises at physical therapy, no trauma or falls. No incontinence or inner leg numbness.   Pain exacerbated with laying down, bending and twisting.   Has not tried any interventions for his pain.  Has tried Robaxin in the past, was not effective.  The history is provided by the patient and medical records.  Back Pain   Past Medical History:  Diagnosis Date   Asthma    Back pain    Depression    GERD (gastroesophageal reflux disease)     Patient Active Problem List   Diagnosis Date Noted   Tobacco use 04/27/2019   Exercise-induced asthma 04/27/2019   Routine general medical examination at a health care facility 04/27/2019    Past Surgical History:  Procedure Laterality Date   EYE SURGERY Left    age 36        Home Medications    Prior to Admission medications   Medication Sig Start Date End Date Taking? Authorizing Provider  naproxen (NAPROSYN) 500 MG tablet Take 1 tablet (500 mg total) by mouth 2 (two) times daily. 04/23/23  Yes Rinaldo Ratel, [place] N, FNP  albuterol (VENTOLIN HFA) 108 (90 Base) MCG/ACT inhaler Inhale 2 puffs into the lungs every 6 (six) hours as needed for shortness of breath and wheezing. 10/28/22   Shirline Frees, NP  buPROPion (WELLBUTRIN SR) 150 MG 12 hr tablet Take 1 tablet (150 mg total) by mouth 2 (two) times daily. 10/28/22   Nafziger, Kandee Keen, NP  Cholecalciferol (VITAMIN D-3) 125 MCG (5000 UT) TABS Take 1 tablet by mouth daily. 02/26/21   Hilts, Casimiro Needle, MD  citalopram (CELEXA) 10 MG tablet Take 1 tablet (10 mg total) by  mouth daily. 12/15/22   Nafziger, Kandee Keen, NP  hyoscyamine (LEVSIN SL) 0.125 MG SL tablet Place 1 tablet (0.125 mg total) under the tongue every 6 (six) hours as needed. 09/16/22   Sherrilyn Rist, MD  lidocaine (XYLOCAINE) 2 % solution Use as directed 15 mLs in the mouth or throat every 6 (six) hours as needed for mouth pain. 12/28/21   Henderly, Britni A, PA-C  ondansetron (ZOFRAN-ODT) 4 MG disintegrating tablet Take 1 tablet (4 mg total) by mouth every 8 (eight) hours as needed for nausea or vomiting. 06/16/22   Gailen Shelter, PA  pantoprazole (PROTONIX) 40 MG tablet Take 1 tablet (40 mg total) by mouth 2 (two) times daily. 07/24/22   Meredith Pel, NP  sucralfate (CARAFATE) 1 g tablet Take 1 tablet (1 g total) by mouth 4 (four) times daily - after meals and at bedtime. 07/24/22   Meredith Pel, NP  tiZANidine (ZANAFLEX) 2 MG tablet Take 1 tablet (2 mg total) by mouth every 6 (six) hours as needed for muscle spasms. 10/28/22   Shirline Frees, NP    Family History Family History  Problem Relation Age of Onset   Asthma Mother    Gout Father    Diabetes Maternal Grandmother     Social History Social  History   Tobacco Use   Smoking status: Former    Current packs/day: 1.00    Average packs/day: 1 pack/day for 10.0 years (10.0 ttl pk-yrs)    Types: Cigarettes   Smokeless tobacco: Never  Vaping Use   Vaping status: Never Used  Substance Use Topics   Alcohol use: Yes    Comment: 1 per day   Drug use: No     Allergies   Tylenol [acetaminophen], Peanut-containing drug products, and Shellfish allergy   Review of Systems Review of Systems  Musculoskeletal:  Positive for back pain. Negative for gait problem.     Physical Exam Triage Vital Signs ED Triage Vitals  Encounter Vitals Group     BP 04/23/23 0916 102/66     Systolic BP Percentile --      Diastolic BP Percentile --      Pulse Rate 04/23/23 0915 61     Resp 04/23/23 0915 16     Temp 04/23/23 0915 97.7 F (36.5  C)     Temp Source 04/23/23 0915 Oral     SpO2 04/23/23 0915 97 %     Weight --      Height --      Head Circumference --      Peak Flow --      Pain Score 04/23/23 0916 8     Pain Loc --      Pain Education --      Exclude from Growth Chart --    No data found.  Updated Vital Signs BP 102/66 (BP Location: Left Arm)   Pulse 61   Temp 97.7 F (36.5 C) (Oral)   Resp 16   SpO2 97%   Visual Acuity Right Eye Distance:   Left Eye Distance:   Bilateral Distance:    Right Eye Near:   Left Eye Near:    Bilateral Near:     Physical Exam Vitals and nursing note reviewed.  Constitutional:      Appearance: Normal appearance.  HENT:     Head: Normocephalic and atraumatic.     Right Ear: External ear normal.     Left Ear: External ear normal.     Nose: Nose normal.     Mouth/Throat:     Mouth: Mucous membranes are moist.  Eyes:     Conjunctiva/sclera: Conjunctivae normal.  Cardiovascular:     Rate and Rhythm: Normal rate.  Pulmonary:     Effort: Pulmonary effort is normal. No respiratory distress.  Musculoskeletal:        General: Tenderness present. No swelling, deformity or signs of injury.     Cervical back: Normal.     Thoracic back: Normal.     Lumbar back: Tenderness present. Decreased range of motion. Positive left straight leg raise test.     Comments: Diffuse lumbar TTP and positive SLR of left side. Spine w/o step off or deformity.   Skin:    General: Skin is warm and dry.  Neurological:     General: No focal deficit present.     Mental Status: He is alert and oriented to person, place, and time.  Psychiatric:        Mood and Affect: Mood normal.        Behavior: Behavior normal. Behavior is cooperative.      UC Treatments / Results  Labs (all labs ordered are listed, but only abnormal results are displayed) Labs Reviewed - No data to display  EKG   Radiology No  results found.  Procedures Procedures (including critical care  time)  Medications Ordered in UC Medications  dexamethasone (DECADRON) injection 10 mg (has no administration in time range)    Initial Impression / Assessment and Plan / UC Course  I have reviewed the triage vital signs and the nursing notes.  Pertinent labs & imaging results that were available during my care of the patient were reviewed by me and considered in my medical decision making (see chart for details).  Vitals and triage reviewed, patient is hemodynamically stable.  Diffuse lumbar tenderness and positive straight leg raise of left side.  Atraumatic back pain, negative for inner leg numbness, no incontinence.  Will trial IM steroid injection and anti-inflammatories.  Encouraged symptomatic management and to continue his physical therapy.  Work note provided.  Plan of care, follow-up care and return precautions given, no questions at this time.     Final Clinical Impressions(s) / UC Diagnoses   Final diagnoses:  Chronic midline low back pain with left-sided sciatica     Discharge Instructions      We have given you an IM steroid injection in clinic for your back pain, please take the anti-inflammatories as needed.  He can take them with food, stop them immediately if you notice any blood in your stool.  Continue with physical therapy.  Ensure you are using proper lifting posture at work and proper form of physical therapy.  Seek immediate care if you develop any inner leg numbness, incontinence, trouble walking, or any new concerning symptoms.      ED Prescriptions     Medication Sig Dispense Auth. Provider   naproxen (NAPROSYN) 500 MG tablet Take 1 tablet (500 mg total) by mouth 2 (two) times daily. 30 tablet Dereka Lueras, [place] N, [place]      PDMP not reviewed this encounter.   Rinaldo Ratel [place] N, [place] 04/23/23 [id number]

## 2023-05-05 ENCOUNTER — Other Ambulatory Visit (HOSPITAL_COMMUNITY): Payer: Self-pay

## 2023-07-07 ENCOUNTER — Ambulatory Visit: Payer: No Typology Code available for payment source | Admitting: Adult Health

## 2023-07-30 ENCOUNTER — Ambulatory Visit (INDEPENDENT_AMBULATORY_CARE_PROVIDER_SITE_OTHER): Payer: No Typology Code available for payment source | Admitting: Adult Health

## 2023-07-30 ENCOUNTER — Other Ambulatory Visit (HOSPITAL_COMMUNITY): Payer: Self-pay

## 2023-07-30 ENCOUNTER — Encounter: Payer: Self-pay | Admitting: Adult Health

## 2023-07-30 VITALS — BP 102/60 | HR 82 | Temp 98.1°F | Ht 65.25 in | Wt 171.0 lb

## 2023-07-30 DIAGNOSIS — M545 Low back pain, unspecified: Secondary | ICD-10-CM | POA: Diagnosis not present

## 2023-07-30 DIAGNOSIS — Z23 Encounter for immunization: Secondary | ICD-10-CM | POA: Diagnosis not present

## 2023-07-30 DIAGNOSIS — G8929 Other chronic pain: Secondary | ICD-10-CM | POA: Diagnosis not present

## 2023-07-30 MED ORDER — CYCLOBENZAPRINE HCL 10 MG PO TABS
10.0000 mg | ORAL_TABLET | Freq: Every day | ORAL | 1 refills | Status: DC
  Filled 2023-07-30: qty 30, 30d supply, fill #0
  Filled 2023-09-08: qty 30, 30d supply, fill #1

## 2023-07-30 NOTE — Progress Notes (Signed)
Subjective:    Patient ID: Brett Henry, male    DOB: 1990-08-14, 32 y.o.   MRN: [id number]  HPI 32 year old male who  has a past medical history of Asthma, Back pain, Depression, and GERD (gastroesophageal reflux disease).  He has a history of chronic low back pain.  In the past this has seemed to be more muscular in origin.  Has had an MRI of his spine in May 2024 which showed no acute abnormality.  He does do heavy lifting for work.  Reports pain on both sides of his low back with bending twisting and carrying motions.  Has not had any issues with bowel or bladder.  Does sometimes endorse pain that radiates down his legs.  Back in September he did try physical therapy without any improvement.  They advised dry needling but he never went through with it.  Review of Systems See HPI   Past Medical History:  Diagnosis Date   Asthma    Back pain    Depression    GERD (gastroesophageal reflux disease)     Social History   Socioeconomic History   Marital status: Married    Spouse name: Not on file   Number of children: 4   Years of education: Not on file   Highest education level: Not on file  Occupational History   Occupation: Warehouse selector  Tobacco Use   Smoking status: Former    Current packs/day: 1.00    Average packs/day: 1 pack/day for 10.0 years (10.0 ttl pk-yrs)    Types: Cigarettes   Smokeless tobacco: Never  Vaping Use   Vaping status: Never Used  Substance and Sexual Activity   Alcohol use: Yes    Comment: 1 per day   Drug use: No   Sexual activity: Yes  Other Topics Concern   Not on file  Social History Narrative   Not currently working    Social Drivers of Corporate investment banker Strain: Not on file  Food Insecurity: Not on file  Transportation Needs: Not on file  Physical Activity: Not on file  Stress: Not on file  Social Connections: Not on file  Intimate Partner Violence: Not on file    Past Surgical History:  Procedure  Laterality Date   EYE SURGERY Left    age 37     Family History  Problem Relation Age of Onset   Asthma Mother    Gout Father    Diabetes Maternal Grandmother     Allergies  Allergen Reactions   Tylenol [Acetaminophen] Anaphylaxis   Peanut-Containing Drug Products Hives   Shellfish Allergy Hives    Current Outpatient Medications on File Prior to Visit  Medication Sig Dispense Refill   albuterol (VENTOLIN HFA) 108 (90 Base) MCG/ACT inhaler Inhale 2 puffs into the lungs every 6 (six) hours as needed for shortness of breath and wheezing. 6.7 g 0   buPROPion (WELLBUTRIN SR) 150 MG 12 hr tablet Take 1 tablet (150 mg total) by mouth 2 (two) times daily. 180 tablet 0   Cholecalciferol (VITAMIN D-3) 125 MCG (5000 UT) TABS Take 1 tablet by mouth daily. 90 tablet 3   citalopram (CELEXA) 10 MG tablet Take 1 tablet (10 mg total) by mouth daily. 30 tablet 3   hyoscyamine (LEVSIN SL) 0.125 MG SL tablet Place 1 tablet (0.125 mg total) under the tongue every 6 (six) hours as needed. 30 tablet 2   naproxen (NAPROSYN) 500 MG tablet Take 1 tablet (500 mg  total) by mouth 2 (two) times daily. 30 tablet 0   ondansetron (ZOFRAN-ODT) 4 MG disintegrating tablet Take 1 tablet (4 mg total) by mouth every 8 (eight) hours as needed for nausea or vomiting. 20 tablet 0   pantoprazole (PROTONIX) 40 MG tablet Take 1 tablet (40 mg total) by mouth 2 (two) times daily. 60 tablet 1   sucralfate (CARAFATE) 1 g tablet Take 1 tablet (1 g total) by mouth 4 (four) times daily - after meals and at bedtime. 120 tablet 1   tiZANidine (ZANAFLEX) 2 MG tablet Take 1 tablet (2 mg total) by mouth every 6 (six) hours as needed for muscle spasms. 30 tablet 1   No current facility-administered medications on file prior to visit.    BP 102/60   Pulse 82   Temp 98.1 F (36.7 C) (Oral)   Ht 5' 5.25" (1.657 m)   Wt 171 lb (77.6 kg)   SpO2 97%   BMI 28.24 kg/m       Objective:   Physical Exam Vitals and nursing note reviewed.   Constitutional:      Appearance: Normal appearance.  Musculoskeletal:        General: Tenderness present. Normal range of motion.     Lumbar back: Swelling and tenderness present. No deformity, spasms or bony tenderness. Normal range of motion.       Back:  Skin:    General: Skin is warm and dry.  Neurological:     General: No focal deficit present.     Mental Status: He is alert and oriented to person, place, and time.  Psychiatric:        Mood and Affect: Mood normal.        Behavior: Behavior normal.        Thought Content: Thought content normal.        Judgment: Judgment normal.        Assessment & Plan:  1. Chronic bilateral low back pain without sciatica (Primary) - Will send in Flexeril. Referral placed for PT; I think he would benefit greatly from dry needling.  - Ambulatory referral to Physical Therapy - cyclobenzaprine (FLEXERIL) 10 MG tablet; Take 1 tablet (10 mg total) by mouth at bedtime.  Dispense: 30 tablet; Refill: 1  2. Need for influenza vaccination  - Flu vaccine trivalent PF, 6mos and older(Flulaval,Afluria,Fluarix,Fluzone)  Shirline Frees, NP

## 2023-07-30 NOTE — Patient Instructions (Signed)
It was great seeing you today   I have sent in a prescription for Flexeril and have referred you to PT

## 2023-08-07 ENCOUNTER — Telehealth: Payer: No Typology Code available for payment source | Admitting: Family Medicine

## 2023-08-07 DIAGNOSIS — A084 Viral intestinal infection, unspecified: Secondary | ICD-10-CM

## 2023-08-07 MED ORDER — ONDANSETRON 4 MG PO TBDP
4.0000 mg | ORAL_TABLET | Freq: Three times a day (TID) | ORAL | 0 refills | Status: DC | PRN
  Filled 2023-08-07: qty 18, 21d supply, fill #0

## 2023-08-07 NOTE — Progress Notes (Signed)
Virtual Visit Consent   [facility], you are scheduled for a virtual visit with a [place] provider today. Just as with appointments in the office, your consent must be obtained to participate. Your consent will be active for this visit and any virtual visit you may have with one of our providers in the next 365 days. If you have a MyChart account, a copy of this consent can be sent to you electronically.  As this is a virtual visit, video technology does not allow for your provider to perform a traditional examination. This may limit your provider's ability to fully assess your condition. If your provider identifies any concerns that need to be evaluated in person or the need to arrange testing (such as labs, EKG, etc.), we will make arrangements to do so. Although advances in technology are sophisticated, we cannot ensure that it will always work on either your end or our end. If the connection with a video visit is poor, the visit may have to be switched to a telephone visit. With either a video or telephone visit, we are not always able to ensure that we have a secure connection.  By engaging in this virtual visit, you consent to the provision of healthcare and authorize for your insurance to be billed (if applicable) for the services provided during this visit. Depending on your insurance coverage, you may receive a charge related to this service.  I need to obtain your verbal consent now. Are you willing to proceed with your visit today? Kaceton Krane has provided verbal consent on 08/07/2023 for a virtual visit (video or telephone). Georgana Curio, FNP  Date: 08/07/2023 4:56 PM  Virtual Visit via Video Note   I, Georgana Curio, connected with  Quantel Driskel  ([id number], 1991-07-16) on 08/07/23 at  5:00 PM EST by a video-enabled telemedicine application and verified that I am speaking with the correct person using two identifiers.  Location: Patient: Virtual Visit Location Patient:  Home Provider: Virtual Visit Location Provider: Home Office   I discussed the limitations of evaluation and management by telemedicine and the availability of in person appointments. The patient expressed understanding and agreed to proceed.    History of Present Illness: Brett Henry is a 32 y.o. who identifies as a male who was assigned male at birth, and is being seen today for nausea, vomiting and diarrhea with fever for 2 days. No vomiting since this am but nausea persists. In no distress. No abd pain. 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   Routine general medical examination at a health care facility 04/27/2019    Allergies:  Allergies  Allergen Reactions   Tylenol [Acetaminophen] Anaphylaxis   Peanut-Containing Drug Products Hives   Shellfish Allergy Hives   Medications:  Current Outpatient Medications:    albuterol (VENTOLIN HFA) 108 (90 Base) MCG/ACT inhaler, Inhale 2 puffs into the lungs every 6 (six) hours as needed for shortness of breath and wheezing., Disp: 6.7 g, Rfl: 0   buPROPion (WELLBUTRIN SR) 150 MG 12 hr tablet, Take 1 tablet (150 mg total) by mouth 2 (two) times daily., Disp: 180 tablet, Rfl: 0   Cholecalciferol (VITAMIN D-3) 125 MCG (5000 UT) TABS, Take 1 tablet by mouth daily., Disp: 90 tablet, Rfl: 3   citalopram (CELEXA) 10 MG tablet, Take 1 tablet (10 mg total) by mouth daily., Disp: 30 tablet, Rfl: 3  cyclobenzaprine (FLEXERIL) 10 MG tablet, Take 1 tablet (10 mg total) by mouth at bedtime., Disp: 30 tablet, Rfl: 1   hyoscyamine (LEVSIN SL) 0.125 MG SL tablet, Place 1 tablet (0.125 mg total) under the tongue every 6 (six) hours as needed., Disp: 30 tablet, Rfl: 2   naproxen (NAPROSYN) 500 MG tablet, Take 1 tablet (500 mg total) by mouth 2 (two) times daily., Disp: 30 tablet, Rfl: 0   ondansetron (ZOFRAN-ODT) 4 MG disintegrating tablet, Take 1 tablet (4  mg total) by mouth every 8 (eight) hours as needed for nausea or vomiting., Disp: 20 tablet, Rfl: 0   pantoprazole (PROTONIX) 40 MG tablet, Take 1 tablet (40 mg total) by mouth 2 (two) times daily., Disp: 60 tablet, Rfl: 1   sucralfate (CARAFATE) 1 g tablet, Take 1 tablet (1 g total) by mouth 4 (four) times daily - after meals and at bedtime., Disp: 120 tablet, Rfl: 1   tiZANidine (ZANAFLEX) 2 MG tablet, Take 1 tablet (2 mg total) by mouth every 6 (six) hours as needed for muscle spasms., Disp: 30 tablet, Rfl: 1  Observations/Objective: Patient is well-developed, well-nourished in no acute distress.  Resting comfortably  at home.  Head is normocephalic, atraumatic.  No labored breathing.  Speech is clear and coherent with logical content.  Patient is alert and oriented at baseline.    Assessment and Plan: 1. Viral gastroenteritis (Primary)  Increase fluids, no milk or dairy, UC if sx worsen.   Follow Up Instructions: I discussed the assessment and treatment plan with the patient. The patient was provided an opportunity to ask questions and all were answered. The patient agreed with the plan and demonstrated an understanding of the instructions.  A copy of instructions were sent to the patient via MyChart unless otherwise noted below.     The patient was advised to call back or seek an in-person evaluation if the symptoms worsen or if the condition fails to improve as anticipated.    Georgana Curio, FNP

## 2023-08-07 NOTE — Patient Instructions (Signed)

## 2023-08-08 ENCOUNTER — Other Ambulatory Visit (HOSPITAL_COMMUNITY): Payer: Self-pay

## 2023-08-12 ENCOUNTER — Ambulatory Visit (HOSPITAL_COMMUNITY)
Admission: RE | Admit: 2023-08-12 | Discharge: 2023-08-12 | Disposition: A | Discharge status: Home / Self Care | Payer: Medicaid Other | Source: Ambulatory Visit | Attending: Family Medicine | Admitting: Family Medicine

## 2023-08-12 ENCOUNTER — Ambulatory Visit (INDEPENDENT_AMBULATORY_CARE_PROVIDER_SITE_OTHER): Payer: Medicaid Other

## 2023-08-12 ENCOUNTER — Encounter (HOSPITAL_COMMUNITY): Payer: Self-pay

## 2023-08-12 VITALS — BP 123/73 | HR 74 | Temp 97.7°F | Resp 16

## 2023-08-12 DIAGNOSIS — M25511 Pain in right shoulder: Secondary | ICD-10-CM | POA: Diagnosis not present

## 2023-08-12 DIAGNOSIS — M542 Cervicalgia: Secondary | ICD-10-CM

## 2023-08-12 NOTE — Discharge Instructions (Addendum)
-   Your shoulder pain is likely related to your neck pain. - I Highly recommend following up with sports medicine clinic outpatient for further evaluation - Start the home exercises for neck and shoulder range of motion - You can start Tylenol and/or Naproxen  for pain.  Tizanidine  may be helpful for sleep - Start lidocaine  patches on the neck and right shoulder for relief as well as ice or heat, whichever 1 provides most relief. - Avoid heavy lifting until further evaluation

## 2023-08-12 NOTE — ED Triage Notes (Signed)
 Pt states she woke up on Tuesday with right shoulder pain he was cleaning the night before but denies any injury. He has been taking IBU as needed.

## 2023-08-12 NOTE — ED Provider Notes (Signed)
 [name]    CSN: [id number] Arrival date & time: 08/12/23  1201      History   Chief Complaint Chief Complaint  Patient presents with   Shoulder Pain    Entered by patient    HPI Brett Henry is a 33 y.o. male.   Brett Henry is a 33 year old male presenting with neck and right shoulder pain that started suddenly on Wednesday morning after waking up.  He has had difficulty with range of motion and pain flexing past 90 degrees and abduction at 90 degrees.  He is having some radiating pain from the neck down the right arm with some intermittent numbness of the fingers but cannot specify which ones.  He is not noticing any weakness.  He denies any trauma or repetitive motions aside from cleaning the night before.  He denies any distal numbness, weakness, dizziness, gait instability, unintentional weight loss or loss of bowel or bladder.  Of note he did play football and basketball when he was younger but no known injuries to the shoulder or neck.  The history is provided by the patient.  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 Authorizing Provider  buPROPion  (WELLBUTRIN  SR) 150 MG 12 hr tablet Take 1 tablet (150 mg total) by mouth 2 (two) times daily. 10/28/22  Yes Nafziger, Darleene, NP  citalopram  (CELEXA ) 10 MG tablet Take 1 tablet (10 mg total) by mouth daily. 12/15/22  Yes Nafziger, Darleene, NP  cyclobenzaprine  (FLEXERIL ) 10 MG  tablet Take 1 tablet (10 mg total) by mouth at bedtime. 07/30/23  Yes Nafziger, Darleene, NP  pantoprazole  (PROTONIX ) 40 MG tablet Take 1 tablet (40 mg total) by mouth 2 (two) times daily. 07/24/22  Yes Kerman Vina HERO, NP  albuterol  (VENTOLIN  HFA) 108 (90 Base) MCG/ACT inhaler Inhale 2 puffs into the lungs every 6 (six) hours as needed for shortness of breath and wheezing. 10/28/22   Merna Darleene, NP  Cholecalciferol  (VITAMIN D -3) 125 MCG (5000 UT) TABS Take 1 tablet by mouth daily. 02/26/21   Hilts, Ozell, MD  hyoscyamine  (LEVSIN  SL) 0.125 MG SL tablet Place 1 tablet (0.125 mg total) under the tongue every 6 (six) hours as needed. 09/16/22   Legrand Victory LITTIE DOUGLAS, MD  naproxen  (NAPROSYN ) 500 MG tablet Take 1 tablet (500 mg total) by mouth 2 (two) times daily. 04/23/23   Dreama, Georgia  N, FNP  ondansetron  (ZOFRAN -ODT) 4 MG disintegrating tablet Take 1 tablet (4 mg total) by mouth every 8 (eight) hours as needed for nausea or vomiting. 06/16/22   Neldon Hamp RAMAN, PA  ondansetron  (ZOFRAN -ODT) 4 MG disintegrating tablet Take 1 tablet (4 mg total) by mouth every 8 (eight) hours as needed for nausea or vomiting. 08/07/23   Blair, Diane W, FNP  sucralfate  (CARAFATE ) 1 g tablet Take 1 tablet (1 g total) by mouth  4 (four) times daily - after meals and at bedtime. 07/24/22   Kerman Vina HERO, NP  tiZANidine  (ZANAFLEX ) 2 MG tablet Take 1 tablet (2 mg total) by mouth every 6 (six) hours as needed for muscle spasms. 10/28/22   Nafziger, Darleene, NP    Family History Family History  Problem Relation Age of Onset   Asthma Mother    Gout Father    Diabetes Maternal Grandmother     Social History Social History   Tobacco Use   Smoking status: Former    Current packs/day: 1.00    Average packs/day: 1 pack/day for 10.0 years (10.0 ttl pk-yrs)    Types: Cigarettes   Smokeless tobacco: Never  Vaping Use   Vaping status: Never Used  Substance Use Topics   Alcohol use: Yes    Comment: 1 per day   Drug use: No      Allergies   Tylenol [acetaminophen]   Review of Systems Review of Systems  HENT:  Negative for tinnitus.   Eyes:  Negative for photophobia and visual disturbance.  Musculoskeletal:  Positive for arthralgias, myalgias and neck pain. Negative for back pain, gait problem, joint swelling and neck stiffness.  Skin:  Negative for color change and rash.  Neurological:  Positive for numbness (Intermittently of the right hand). Negative for dizziness, weakness, light-headedness and headaches.     Physical Exam Triage Vital Signs ED Triage Vitals  Encounter Vitals Group     BP 08/12/23 1218 123/73     Systolic BP Percentile --      Diastolic BP Percentile --      Pulse Rate 08/12/23 1218 74     Resp 08/12/23 1218 16     Temp 08/12/23 1218 97.7 F (36.5 C)     Temp Source 08/12/23 1218 Oral     SpO2 08/12/23 1218 97 %     Weight --      Height --      Head Circumference --      Peak Flow --      Pain Score 08/12/23 1215 8     Pain Loc --      Pain Education --      Exclude from Growth Chart --    No data found.  Updated Vital Signs BP 123/73 (BP Location: Left Arm)   Pulse 74   Temp 97.7 F (36.5 C) (Oral)   Resp 16   SpO2 97%   Visual Acuity Right Eye Distance:   Left Eye Distance:   Bilateral Distance:    Right Eye Near:   Left Eye Near:    Bilateral Near:     Physical Exam Vitals reviewed.  Constitutional:      General: He is not in acute distress.    Appearance: Normal appearance. He is normal weight. He is not ill-appearing, toxic-appearing or diaphoretic.  Musculoskeletal:     Comments: Neck: Inspection no abnormalities or deformities, cervical lordosis is maintained, no JVD No palpable stepoffs. TTP of the overlying and paraspinal musculature.  There is spasticity of the paraspinal muscles and trapezius.  Palpation of the paraspinal muscles at the most tender point reproduces radiating pain down the right arm Spurling's maneuver: Positive on the  right Full neck ROM.   Grip strength and sensation normal in bilateral hands Strength good C4 to T1 distribution No sensory change to C4 to T1 Negative Hoffman sign bilaterally  Shoulder: Inspection no abnormalities ROM: Active range of motion restricted to 90 degrees of  flexion and 90 degrees of abduction, internal rotation just past midline. Passive flexion and abduction is full Strength: 5/5 in all directions AC joint: Mildly tender Special tests: Empty can equivocal, O'Brien's positive Hawkins, Neer's both positive Crossover positive      Skin:    General: Skin is warm and dry.     Capillary Refill: Capillary refill takes 2 to 3 seconds.     Coloration: Skin is not jaundiced.     Findings: No erythema.  Neurological:     General: No focal deficit present.     Mental Status: He is alert and oriented to person, place, and time.     Sensory: No sensory deficit.     Gait: Gait normal.     Deep Tendon Reflexes: Reflexes normal.  Psychiatric:        Mood and Affect: Mood normal.        Behavior: Behavior normal.      UC Treatments / Results  Labs (all labs ordered are listed, but only abnormal results are displayed) Labs Reviewed - No data to display  EKG   Radiology No results found.  Procedures Procedures (including critical care time)  Medications Ordered in UC Medications - No data to display  Initial Impression / Assessment and Plan / UC Course  I have reviewed the triage vital signs and the nursing notes.  Pertinent labs & imaging results that were available during my care of the patient were reviewed by me and considered in my medical decision making (see chart for details).     Neck pain with right sided upper extremity radiculopathy -The patient does not report any trauma to the area but does have some chronic neck pain. - His exam is concerning for possible radicular pain - Cervical x-rays show some mild posterior degeneration of the C4-C5  vertebral bodies but no acute fractures, or further arthropathy other than total loss of cervical lordosis - Neck range of motion exercises given and we discussed the use of NSAIDs, Tylenol, and lidocaine  patches along with heat or ice, whichever one is most beneficial. - I recommended follow-up with sports medicine clinic for further evaluation  Right shoulder pain  -The patient's exam is difficult to completely differentiate given his diffuse pain with all range of motion.  There is no concern for infection.  He has not had any trauma to the area or injury that he knows of. - X-ray: Does not show any abnormalities - Range of motion and strengthening exercises given for today.  However, I do suspect his pain is largely coming from the neck. - We discussed the use of lidocaine  patches, Tylenol, and NSAIDs, as well as ice - I recommended he follow-up with sports medicine clinic for further evaluation.  Final diagnoses:  Neck pain  Acute pain of right shoulder     Discharge Instructions      - Your shoulder pain is likely related to your neck pain. - I Highly recommend following up with sports medicine clinic outpatient for further evaluation - Start the home exercises for neck and shoulder range of motion - You can start Tylenol and/or Naproxen  for pain.  Tizanidine  may be helpful for sleep - Start lidocaine  patches on the neck and right shoulder for relief as well as ice or heat, whichever 1 provides most relief. - Avoid heavy lifting until further evaluation     ED Prescriptions   None    PDMP not reviewed this encounter.   Janet Lonni BRAVO,  MD 08/12/23 1421

## 2023-08-18 ENCOUNTER — Ambulatory Visit: Payer: Medicaid Other | Attending: Adult Health

## 2023-08-19 ENCOUNTER — Other Ambulatory Visit (HOSPITAL_COMMUNITY): Payer: Self-pay

## 2023-08-20 ENCOUNTER — Other Ambulatory Visit (HOSPITAL_COMMUNITY): Payer: Self-pay

## 2023-09-08 ENCOUNTER — Telehealth: Payer: Medicaid Other | Admitting: Nurse Practitioner

## 2023-09-08 ENCOUNTER — Other Ambulatory Visit (HOSPITAL_COMMUNITY): Payer: Self-pay

## 2023-09-08 ENCOUNTER — Telehealth: Payer: Medicaid Other

## 2023-09-08 DIAGNOSIS — A084 Viral intestinal infection, unspecified: Secondary | ICD-10-CM | POA: Diagnosis not present

## 2023-09-08 MED ORDER — ONDANSETRON 4 MG PO TBDP
4.0000 mg | ORAL_TABLET | Freq: Three times a day (TID) | ORAL | 0 refills | Status: DC | PRN
  Filled 2023-09-08: qty 20, 7d supply, fill #0

## 2023-09-08 NOTE — Progress Notes (Signed)
Virtual Visit Consent   [facility], you are scheduled for a virtual visit with a [facility] provider today. Just as with appointments in the office, your consent must be obtained to participate. Your consent will be active for this visit and any virtual visit you may have with one of our providers in the next 365 days. If you have a MyChart account, a copy of this consent can be sent to you electronically.  As this is a virtual visit, video technology does not allow for your provider to perform a traditional examination. This may limit your provider's ability to fully assess your condition. If your provider identifies any concerns that need to be evaluated in person or the need to arrange testing (such as labs, EKG, etc.), we will make arrangements to do so. Although advances in technology are sophisticated, we cannot ensure that it will always work on either your end or our end. If the connection with a video visit is poor, the visit may have to be switched to a telephone visit. With either a video or telephone visit, we are not always able to ensure that we have a secure connection.  By engaging in this virtual visit, you consent to the provision of healthcare and authorize for your insurance to be billed (if applicable) for the services provided during this visit. Depending on your insurance coverage, you may receive a charge related to this service.  I need to obtain your verbal consent now. Are you willing to proceed with your visit today? Brett Henry has provided verbal consent on 09/08/2023 for a virtual visit (video or telephone). Viviano Simas, FNP  Date: 09/08/2023 4:07 PM  Virtual Visit via Video Note   I, Viviano Simas, connected with  Brett Henry  ([id number], 05-02-1991) on 09/08/23 at  4:15 PM EST by a video-enabled telemedicine application and verified that I am speaking with the correct person using two identifiers.  Location: Patient: Virtual Visit Location Patient:  Home Provider: Virtual Visit Location Provider: Home Office   I discussed the limitations of evaluation and management by telemedicine and the availability of in person appointments. The patient expressed understanding and agreed to proceed.    History of Present Illness: Brett Henry is a 33 y.o. 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   Routine general medical examination at a health care facility 04/27/2019    Allergies:  Allergies  Allergen Reactions   Tylenol [Acetaminophen] Anaphylaxis   Medications:  Current Outpatient Medications:    albuterol (VENTOLIN HFA) 108 (90 Base) MCG/ACT inhaler, Inhale 2 puffs into the lungs every 6 (six) hours as needed for shortness of breath and wheezing., Disp: 6.7 g, Rfl: 0   buPROPion (WELLBUTRIN SR) 150 MG 12 hr tablet, Take 1 tablet (150 mg total) by mouth 2 (two) times daily., Disp: 180 tablet, Rfl: 0   Cholecalciferol (VITAMIN D-3) 125 MCG (5000 UT) TABS, Take 1 tablet by mouth daily.,  Disp: 90 tablet, Rfl: 3   citalopram (CELEXA) 10 MG tablet, Take 1 tablet (10 mg total) by mouth daily., Disp: 30 tablet, Rfl: 3   cyclobenzaprine (FLEXERIL) 10 MG tablet, Take 1 tablet (10 mg total) by mouth at bedtime., Disp: 30 tablet, Rfl: 1   hyoscyamine (LEVSIN SL) 0.125 MG SL tablet, Place 1 tablet (0.125 mg total) under the tongue every 6 (six) hours as needed., Disp: 30 tablet, Rfl: 2   naproxen (NAPROSYN) 500 MG tablet, Take 1 tablet (500 mg total) by mouth 2 (two)  times daily., Disp: 30 tablet, Rfl: 0   ondansetron (ZOFRAN-ODT) 4 MG disintegrating tablet, Take 1 tablet (4 mg total) by mouth every 8 (eight) hours as needed for nausea or vomiting., Disp: 20 tablet, Rfl: 0   ondansetron (ZOFRAN-ODT) 4 MG disintegrating tablet, Take 1 tablet (4 mg total) by mouth every 8 (eight) hours as needed for nausea or vomiting., Disp: 20 tablet, Rfl: 0   pantoprazole (PROTONIX) 40 MG tablet, Take 1 tablet (40 mg total) by mouth 2 (two) times daily., Disp: 60 tablet, Rfl: 1   sucralfate (CARAFATE) 1 g tablet, Take 1 tablet (1 g total) by mouth 4 (four) times daily - after meals and at bedtime., Disp: 120 tablet, Rfl: 1   tiZANidine (ZANAFLEX) 2 MG tablet, Take 1 tablet (2 mg total) by mouth every 6 (six) hours as needed for muscle spasms., Disp: 30 tablet, Rfl: 1  Observations/Objective: Patient is well-developed, well-nourished in no acute distress.  Resting comfortably  at home.  Head is normocephalic, atraumatic.  No labored breathing.  Speech is clear and coherent with logical content.  Patient is alert and oriented at baseline.    Assessment and Plan:   1. Viral gastroenteritis (Primary)  Discussed bland diet, pushing fluids and monitoring hydration  If vomiting persists or if unable to tolerate fluids or with failure to void patient is instructed to seek follow up in person care   - ondansetron (ZOFRAN-ODT) 4 MG disintegrating tablet; Take 1 tablet (4 mg total) by mouth every 8 (eight) hours as needed.  Dispense: 20 tablet; Refill: 0     Follow Up Instructions: I discussed the assessment and treatment plan with the patient. The patient was provided an opportunity to ask questions and all were answered. The patient agreed with the plan and demonstrated an understanding of the instructions.  A copy of instructions were sent to the patient via MyChart unless otherwise noted below.    The patient was advised to call back or seek an in-person evaluation if  the symptoms worsen or if the condition fails to improve as anticipated.    Viviano Simas, FNP

## 2023-09-09 ENCOUNTER — Other Ambulatory Visit: Payer: Self-pay

## 2023-09-20 ENCOUNTER — Other Ambulatory Visit (HOSPITAL_COMMUNITY): Payer: Self-pay

## 2023-11-10 ENCOUNTER — Ambulatory Visit: Admitting: Adult Health

## 2023-11-10 ENCOUNTER — Other Ambulatory Visit: Payer: Self-pay | Admitting: Adult Health

## 2023-11-10 ENCOUNTER — Other Ambulatory Visit: Payer: Self-pay

## 2023-11-10 ENCOUNTER — Ambulatory Visit (INDEPENDENT_AMBULATORY_CARE_PROVIDER_SITE_OTHER): Admitting: Adult Health

## 2023-11-10 ENCOUNTER — Other Ambulatory Visit (HOSPITAL_COMMUNITY): Payer: Self-pay

## 2023-11-10 VITALS — BP 130/70 | HR 86 | Temp 98.0°F | Ht 65.25 in | Wt 161.0 lb

## 2023-11-10 DIAGNOSIS — R6889 Other general symptoms and signs: Secondary | ICD-10-CM

## 2023-11-10 DIAGNOSIS — M545 Low back pain, unspecified: Secondary | ICD-10-CM

## 2023-11-10 DIAGNOSIS — J020 Streptococcal pharyngitis: Secondary | ICD-10-CM

## 2023-11-10 LAB — POCT RAPID STREP A (OFFICE): Rapid Strep A Screen: POSITIVE — AB

## 2023-11-10 LAB — POCT INFLUENZA A/B
Influenza A, POC: NEGATIVE
Influenza B, POC: NEGATIVE

## 2023-11-10 LAB — POC COVID19 BINAXNOW: SARS Coronavirus 2 Ag: NEGATIVE — AB

## 2023-11-10 MED ORDER — PREDNISONE 20 MG PO TABS
20.0000 mg | ORAL_TABLET | Freq: Every day | ORAL | 0 refills | Status: DC
  Filled 2023-11-10: qty 7, 7d supply, fill #0

## 2023-11-10 MED ORDER — PENICILLIN V POTASSIUM 500 MG PO TABS
500.0000 mg | ORAL_TABLET | Freq: Three times a day (TID) | ORAL | 0 refills | Status: AC
  Filled 2023-11-10: qty 30, 10d supply, fill #0

## 2023-11-10 NOTE — Telephone Encounter (Signed)
 Okay for refill?

## 2023-11-10 NOTE — Progress Notes (Signed)
 Subjective:    Patient ID: Brett Henry, male    DOB: [birth year], 33 y.o.   MRN: [id number]  HPI 33 year old male who  has a past medical history of Asthma, Back pain, Depression, and GERD (gastroesophageal reflux disease).  He presidents to the office today for an acute issue. He reports that for the last three days he has developed a sore throat, body aches, chills and hot flashes. Over the last day he has also developed a headache. Has pain with eating and swallowing.    Review of Systems See HPI   Past Medical History:  Diagnosis Date   Asthma    Back pain    Depression    GERD (gastroesophageal reflux disease)     Social History   Socioeconomic History   Marital status: Married    Spouse name: Not on file   Number of children: 4   Years of education: Not on file   Highest education level: Not on file  Occupational History   Occupation: Warehouse selector  Tobacco Use   Smoking status: Former    Current packs/day: 1.00    Average packs/day: 1 pack/day for 10.0 years (10.0 ttl pk-yrs)    Types: Cigarettes   Smokeless tobacco: Never  Vaping Use   Vaping status: Never Used  Substance and Sexual Activity   Alcohol use: Yes    Comment: 1 per day   Drug use: No   Sexual activity: Yes  Other Topics Concern   Not on file  Social History Narrative   Not currently working    Social Drivers of Corporate investment banker Strain: Not on file  Food Insecurity: Not on file  Transportation Needs: Not on file  Physical Activity: Not on file  Stress: Not on file  Social Connections: Not on file  Intimate Partner Violence: Not on file    Past Surgical History:  Procedure Laterality Date   EYE SURGERY Left    age 39     Family History  Problem Relation Age of Onset   Asthma Mother    Gout Father    Diabetes Maternal Grandmother     Allergies  Allergen Reactions   Tylenol [Acetaminophen] Anaphylaxis    Current Outpatient Medications on File Prior to  Visit  Medication Sig Dispense Refill   albuterol (VENTOLIN HFA) 108 (90 Base) MCG/ACT inhaler Inhale 2 puffs into the lungs every 6 (six) hours as needed for shortness of breath and wheezing. 6.7 g 0   buPROPion (WELLBUTRIN SR) 150 MG 12 hr tablet Take 1 tablet (150 mg total) by mouth 2 (two) times daily. 180 tablet 0   Cholecalciferol (VITAMIN D-3) 125 MCG (5000 UT) TABS Take 1 tablet by mouth daily. 90 tablet 3   citalopram (CELEXA) 10 MG tablet Take 1 tablet (10 mg total) by mouth daily. 30 tablet 3   cyclobenzaprine (FLEXERIL) 10 MG tablet Take 1 tablet (10 mg total) by mouth at bedtime. 30 tablet 1   hyoscyamine (LEVSIN SL) 0.125 MG SL tablet Place 1 tablet (0.125 mg total) under the tongue every 6 (six) hours as needed. 30 tablet 2   naproxen (NAPROSYN) 500 MG tablet Take 1 tablet (500 mg total) by mouth 2 (two) times daily. 30 tablet 0   ondansetron (ZOFRAN-ODT) 4 MG disintegrating tablet Dissolve 1 tablet (4 mg total) by mouth every 8 (eight) hours as needed. 20 tablet 0   pantoprazole (PROTONIX) 40 MG tablet Take 1 tablet (40 mg total) by  mouth 2 (two) times daily. 60 tablet 1   sucralfate (CARAFATE) 1 g tablet Take 1 tablet (1 g total) by mouth 4 (four) times daily - after meals and at bedtime. 120 tablet 1   tiZANidine (ZANAFLEX) 2 MG tablet Take 1 tablet (2 mg total) by mouth every 6 (six) hours as needed for muscle spasms. 30 tablet 1   No current facility-administered medications on file prior to visit.    BP 130/70   Pulse 86   Temp 98 F (36.7 C) (Oral)   Ht 5' 5.25" (1.657 m)   Wt 161 lb (73 kg)   SpO2 99%   BMI 26.59 kg/m       Objective:   Physical Exam Vitals and nursing note reviewed.  Constitutional:      Appearance: Brett appearance.  HENT:     Mouth/Throat:     Tonsils: Tonsillar exudate present. 2+ on the right. 2+ on the left.  Cardiovascular:     Rate and Rhythm: Brett rate and regular rhythm.     Pulses: Brett pulses.     Heart sounds: Brett  heart sounds.  Pulmonary:     Effort: Pulmonary effort is Brett.     Breath sounds: Brett breath sounds.  Musculoskeletal:        General: Brett range of motion.  Lymphadenopathy:     Head:     Right side of head: Submandibular and tonsillar adenopathy present.     Left side of head: Submandibular and tonsillar adenopathy present.  Skin:    General: Skin is warm and dry.  Neurological:     General: No focal deficit present.     Mental Status: He is alert and oriented to person, place, and time.  Psychiatric:        Mood and Affect: Mood Brett.        Behavior: Behavior Brett.        Thought Content: Thought content Brett.        Judgment: Judgment Brett.       Assessment & Plan:  1. Flu-like symptoms (Primary)  - POC COVID-19 BinaxNow- negative - POCT Influenza A/B- Negative - POCT rapid strep A- Positive   2. Strep throat  - penicillin v potassium (VEETID) 500 MG tablet; Take 1 tablet (500 mg total) by mouth 3 (three) times daily for 10 days.  Dispense: 30 tablet; Refill: 0 - predniSONE (DELTASONE) 20 MG tablet; Take 1 tablet (20 mg total) by mouth daily with breakfast.  Dispense: 7 tablet; Refill: 0 - Follow up if not improving in the next 3-4 days   Shirline Frees, NP

## 2023-11-11 ENCOUNTER — Other Ambulatory Visit (HOSPITAL_COMMUNITY): Payer: Self-pay

## 2023-11-11 MED ORDER — CYCLOBENZAPRINE HCL 10 MG PO TABS
10.0000 mg | ORAL_TABLET | Freq: Every day | ORAL | 1 refills | Status: DC
  Filled 2023-11-11 – 2023-12-02 (×2): qty 30, 30d supply, fill #0

## 2023-11-19 ENCOUNTER — Ambulatory Visit: Admitting: Adult Health

## 2023-11-23 ENCOUNTER — Other Ambulatory Visit (HOSPITAL_COMMUNITY): Payer: Self-pay

## 2023-12-02 ENCOUNTER — Other Ambulatory Visit (HOSPITAL_COMMUNITY): Payer: Self-pay

## 2023-12-07 ENCOUNTER — Encounter: Admitting: Adult Health

## 2023-12-07 NOTE — Progress Notes (Deleted)
 Subjective:    Patient ID: Brett Henry, male    DOB: 02-25-1991, 33 y.o.   MRN: [id number]  HPI Patient presents for yearly preventative medicine examination. He is a pleasant 33 year old male who  has a past medical history of Asthma, Back pain, Depression, and GERD (gastroesophageal reflux disease).  Exercise Induced asthma - uses albuterol  inhaler PRN. He denies any recent flares.   GERD/upper abdominal pain  - takes Protonix  40 mg BID ,carafate  PRN and hycosamine 0.125 mg  PRN.  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  Everything else within normal limits.  He has also had a negative endoscopy done 2023  Chronic lumbar back pain - since 2018.  He is had an MRI of the spine in May 2024 which showed no acute abnormality.  He has been through physical therapy and reports that this did not help much.  Pain is usually on both sides and worse with bending, twisting, carrying motions.  Has never had any issues with bowel or bladder.  Sometimes has pain that radiates down his legs.  Tobacco Use - he smokes about three black and milds a day. He would like to quit.   Anxiety and Depression - managed with Wellbutrin  150 mg BID and Celexa  10 mg daily.    All immunizations and health maintenance protocols were reviewed with the patient and needed orders were placed.  Appropriate screening laboratory values were ordered for the patient including screening of hyperlipidemia, renal function and hepatic function. If indicated by BPH, a PSA was ordered.  Medication reconciliation,  past medical history, social history, problem list and allergies were reviewed in detail with the patient  Goals were established with regard to weight loss, exercise, and  diet in compliance with medications Wt Readings from Last 3 Encounters:  11/10/23 161 lb (73 kg)  07/30/23 171 lb (77.6 kg)   12/15/22 166 lb (75.3 kg)    Review of Systems  Constitutional: Negative.   HENT: Negative.    Eyes: Negative.   Respiratory: Negative.    Cardiovascular: Negative.   Gastrointestinal: Negative.   Endocrine: Negative.   Genitourinary: Negative.   Musculoskeletal:  Positive for back pain.  Skin: Negative.   Allergic/Immunologic: Negative.   Neurological: Negative.   Hematological: Negative.   Psychiatric/Behavioral: Negative.    All other systems reviewed and are negative.  Past Medical History:  Diagnosis Date   Asthma    Back pain    Depression    GERD (gastroesophageal reflux disease)     Social History   Socioeconomic History   Marital status: Married    Spouse name: Not on file   Number of children: 4   Years of education: Not on file   Highest education level: Not on file  Occupational History   Occupation: Warehouse selector  Tobacco Use   Smoking status: Former    Current packs/day: 1.00    Average packs/day: 1 pack/day for 10.0 years (10.0 ttl pk-yrs)    Types: Cigarettes   Smokeless tobacco: Never  Vaping Use   Vaping status: Never Used  Substance and Sexual Activity   Alcohol use: Yes    Comment: 1 per day   Drug use: No   Sexual activity: Yes  Other Topics Concern   Not on file  Social History Narrative   Not currently working    Social  Drivers of Corporate investment banker Strain: Not on file  Food Insecurity: Not on file  Transportation Needs: Not on file  Physical Activity: Not on file  Stress: Not on file  Social Connections: Not on file  Intimate Partner Violence: Not on file    Past Surgical History:  Procedure Laterality Date   EYE SURGERY Left    age 48     Family History  Problem Relation Age of Onset   Asthma Mother    Gout Father    Diabetes Maternal Grandmother     Allergies  Allergen Reactions   Tylenol [Acetaminophen] Anaphylaxis    Current Outpatient Medications on File Prior to Visit  Medication Sig  Dispense Refill   albuterol  (VENTOLIN  HFA) 108 (90 Base) MCG/ACT inhaler Inhale 2 puffs into the lungs every 6 (six) hours as needed for shortness of breath and wheezing. 6.7 g 0   buPROPion  (WELLBUTRIN  SR) 150 MG 12 hr tablet Take 1 tablet (150 mg total) by mouth 2 (two) times daily. 180 tablet 0   Cholecalciferol  (VITAMIN D -3) 125 MCG (5000 UT) TABS Take 1 tablet by mouth daily. 90 tablet 3   citalopram  (CELEXA ) 10 MG tablet Take 1 tablet (10 mg total) by mouth daily. 30 tablet 3   cyclobenzaprine  (FLEXERIL ) 10 MG tablet Take 1 tablet (10 mg total) by mouth at bedtime. 30 tablet 1   hyoscyamine  (LEVSIN SL) 0.125 MG SL tablet Place 1 tablet (0.125 mg total) under the tongue every 6 (six) hours as needed. 30 tablet 2   naproxen  (NAPROSYN ) 500 MG tablet Take 1 tablet (500 mg total) by mouth 2 (two) times daily. 30 tablet 0   ondansetron  (ZOFRAN -ODT) 4 MG disintegrating tablet Dissolve 1 tablet (4 mg total) by mouth every 8 (eight) hours as needed. 20 tablet 0   pantoprazole  (PROTONIX ) 40 MG tablet Take 1 tablet (40 mg total) by mouth 2 (two) times daily. 60 tablet 1   predniSONE  (DELTASONE ) 20 MG tablet Take 1 tablet (20 mg total) by mouth daily with breakfast. 7 tablet 0   sucralfate  (CARAFATE ) 1 g tablet Take 1 tablet (1 g total) by mouth 4 (four) times daily - after meals and at bedtime. 120 tablet 1   tiZANidine  (ZANAFLEX ) 2 MG tablet Take 1 tablet (2 mg total) by mouth every 6 (six) hours as needed for muscle spasms. 30 tablet 1   No current facility-administered medications on file prior to visit.    There were no vitals taken for this visit.      Objective:   Physical Exam Vitals and nursing note reviewed.  Constitutional:      General: He is not in acute distress.    Appearance: Normal appearance. He is not ill-appearing.  HENT:     Head: Normocephalic and atraumatic.     Right Ear: Tympanic membrane, ear canal and external ear normal. There is no impacted cerumen.     Left Ear:  Tympanic membrane, ear canal and external ear normal. There is no impacted cerumen.     Nose: Nose normal. No congestion or rhinorrhea.     Mouth/Throat:     Mouth: Mucous membranes are moist.     Pharynx: Oropharynx is clear.  Eyes:     Extraocular Movements: Extraocular movements intact.     Conjunctiva/sclera: Conjunctivae normal.     Pupils: Pupils are equal, round, and reactive to light.  Neck:     Vascular: No carotid bruit.  Cardiovascular:  Rate and Rhythm: Normal rate and regular rhythm.     Pulses: Normal pulses.     Heart sounds: No murmur heard.    No friction rub. No gallop.  Pulmonary:     Effort: Pulmonary effort is normal.     Breath sounds: Normal breath sounds.  Abdominal:     General: Abdomen is flat. Bowel sounds are normal. There is no distension.     Palpations: Abdomen is soft. There is no mass.     Tenderness: There is no abdominal tenderness. There is no guarding or rebound.     Hernia: No hernia is present.  Musculoskeletal:        General: Normal range of motion.     Cervical back: Normal range of motion and neck supple.  Lymphadenopathy:     Cervical: No cervical adenopathy.  Skin:    General: Skin is warm and dry.     Capillary Refill: Capillary refill takes less than 2 seconds.  Neurological:     General: No focal deficit present.     Mental Status: He is alert and oriented to person, place, and time.  Psychiatric:        Mood and Affect: Mood normal.        Behavior: Behavior normal.        Thought Content: Thought content normal.        Judgment: Judgment normal.       Assessment & Plan:

## 2024-01-19 ENCOUNTER — Encounter: Payer: Self-pay | Admitting: Adult Health

## 2024-01-19 ENCOUNTER — Ambulatory Visit (INDEPENDENT_AMBULATORY_CARE_PROVIDER_SITE_OTHER)

## 2024-01-19 ENCOUNTER — Ambulatory Visit (INDEPENDENT_AMBULATORY_CARE_PROVIDER_SITE_OTHER): Admitting: Adult Health

## 2024-01-19 ENCOUNTER — Other Ambulatory Visit (HOSPITAL_COMMUNITY): Payer: Self-pay

## 2024-01-19 VITALS — BP 110/72 | HR 72 | Temp 98.3°F | Ht 65.0 in | Wt 163.0 lb

## 2024-01-19 DIAGNOSIS — M25562 Pain in left knee: Secondary | ICD-10-CM

## 2024-01-19 DIAGNOSIS — Z0000 Encounter for general adult medical examination without abnormal findings: Secondary | ICD-10-CM | POA: Diagnosis not present

## 2024-01-19 DIAGNOSIS — K219 Gastro-esophageal reflux disease without esophagitis: Secondary | ICD-10-CM | POA: Diagnosis not present

## 2024-01-19 DIAGNOSIS — Z23 Encounter for immunization: Secondary | ICD-10-CM

## 2024-01-19 DIAGNOSIS — M545 Low back pain, unspecified: Secondary | ICD-10-CM | POA: Diagnosis not present

## 2024-01-19 DIAGNOSIS — J45990 Exercise induced bronchospasm: Secondary | ICD-10-CM

## 2024-01-19 DIAGNOSIS — G8929 Other chronic pain: Secondary | ICD-10-CM

## 2024-01-19 MED ORDER — CYCLOBENZAPRINE HCL 10 MG PO TABS
10.0000 mg | ORAL_TABLET | Freq: Every day | ORAL | 1 refills | Status: DC
  Filled 2024-01-19: qty 30, 30d supply, fill #0
  Filled 2024-02-15 – 2024-03-28 (×2): qty 30, 30d supply, fill #1

## 2024-01-19 MED ORDER — PANTOPRAZOLE SODIUM 40 MG PO TBEC
40.0000 mg | DELAYED_RELEASE_TABLET | Freq: Two times a day (BID) | ORAL | 1 refills | Status: AC
  Filled 2024-01-19: qty 60, 30d supply, fill #0
  Filled 2024-02-15 – 2024-03-28 (×2): qty 60, 30d supply, fill #1

## 2024-01-19 MED ORDER — NAPROXEN 500 MG PO TABS
500.0000 mg | ORAL_TABLET | Freq: Two times a day (BID) | ORAL | 0 refills | Status: AC
  Filled 2024-01-19: qty 30, 15d supply, fill #0

## 2024-01-19 MED ORDER — ALBUTEROL SULFATE HFA 108 (90 BASE) MCG/ACT IN AERS
2.0000 | INHALATION_SPRAY | Freq: Four times a day (QID) | RESPIRATORY_TRACT | 1 refills | Status: DC | PRN
  Filled 2024-01-19: qty 6.7, 25d supply, fill #0
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
## 2024-01-20 ENCOUNTER — Ambulatory Visit: Payer: Self-pay | Admitting: Adult Health

## 2024-01-20 LAB — COMPREHENSIVE METABOLIC PANEL WITH GFR
AG Ratio: 1.4 (calc) (ref 1.0–2.5)
ALT: 18 U/L (ref 9–46)
AST: 18 U/L (ref 10–40)
Albumin: 4.9 g/dL (ref 3.6–5.1)
Alkaline phosphatase (APISO): 61 U/L (ref 36–130)
BUN: 13 mg/dL (ref 7–25)
CO2: 22 mmol/L (ref 20–32)
Calcium: 9.9 mg/dL (ref 8.6–10.3)
Chloride: 105 mmol/L (ref 98–110)
Creat: 1.04 mg/dL (ref 0.60–1.26)
Globulin: 3.4 g/dL (ref 1.9–3.7)
Glucose, Bld: 84 mg/dL (ref 65–99)
Potassium: 4.3 mmol/L (ref 3.5–5.3)
Sodium: 138 mmol/L (ref 135–146)
Total Bilirubin: 0.6 mg/dL (ref 0.2–1.2)
Total Protein: 8.3 g/dL — ABNORMAL HIGH (ref 6.1–8.1)
eGFR: 97 mL/min/{1.73_m2} (ref >=60)

## 2024-01-20 LAB — CBC
HCT: 47.5 % (ref 38.5–50.0)
Hemoglobin: 16 g/dL (ref 13.2–17.1)
MCH: 30.1 pg (ref 27.0–33.0)
MCHC: 33.7 g/dL (ref 32.0–36.0)
MCV: 89.5 fL (ref 80.0–100.0)
MPV: 10.5 fL (ref 7.5–12.5)
Platelets: 230 10*3/uL (ref 140–400)
RBC: 5.31 10*6/uL (ref 4.20–5.80)
RDW: 12.5 % (ref 11.0–15.0)
WBC: 9.4 10*3/uL (ref 3.8–10.8)

## 2024-01-20 LAB — TSH: TSH: 0.44 m[IU]/L (ref 0.40–4.50)

## 2024-01-20 LAB — LIPID PANEL
Cholesterol: 226 mg/dL — ABNORMAL HIGH (ref <200)
HDL: 51 mg/dL (ref >=40)
LDL Cholesterol (Calc): 155 mg/dL — ABNORMAL HIGH
Non-HDL Cholesterol (Calc): 175 mg/dL — ABNORMAL HIGH (ref <130)
Total CHOL/HDL Ratio: 4.4 (calc) (ref <5.0)
Triglycerides: 91 mg/dL (ref <150)

## 2024-01-21 ENCOUNTER — Telehealth: Admitting: Physician Assistant

## 2024-01-21 DIAGNOSIS — Z5321 Procedure and treatment not carried out due to patient leaving prior to being seen by health care provider: Secondary | ICD-10-CM

## 2024-01-21 NOTE — Progress Notes (Signed)
  Patient had arrived prior to the appointment time and left before being seen. Did not attempt to rejoin the visit despite links being sent and provider waiting for at least 10 minutes from appointment time for patient to join.   They will be marked as a NS for this appointment/time.   Angelia Kelp, PA-C

## 2024-02-15 ENCOUNTER — Other Ambulatory Visit (HOSPITAL_COMMUNITY): Payer: Self-pay

## 2024-02-24 ENCOUNTER — Other Ambulatory Visit (HOSPITAL_COMMUNITY): Payer: Self-pay

## 2024-03-09 ENCOUNTER — Other Ambulatory Visit (HOSPITAL_COMMUNITY): Payer: Self-pay

## 2024-03-28 ENCOUNTER — Other Ambulatory Visit (HOSPITAL_COMMUNITY): Payer: Self-pay

## 2024-04-17 ENCOUNTER — Telehealth: Admitting: Physician Assistant

## 2024-04-17 ENCOUNTER — Other Ambulatory Visit (HOSPITAL_COMMUNITY): Payer: Self-pay

## 2024-04-17 DIAGNOSIS — J069 Acute upper respiratory infection, unspecified: Secondary | ICD-10-CM | POA: Diagnosis not present

## 2024-04-17 MED ORDER — PSEUDOEPH-BROMPHEN-DM 30-2-10 MG/5ML PO SYRP
5.0000 mL | ORAL_SOLUTION | Freq: Four times a day (QID) | ORAL | 0 refills | Status: AC | PRN
  Filled 2024-04-17: qty 120, 6d supply, fill #0

## 2024-04-17 MED ORDER — FLUTICASONE PROPIONATE 50 MCG/ACT NA SUSP
2.0000 | Freq: Every day | NASAL | 0 refills | Status: AC
  Filled 2024-04-17: qty 16, 30d supply, fill #0

## 2024-04-17 MED ORDER — ALBUTEROL SULFATE HFA 108 (90 BASE) MCG/ACT IN AERS
1.0000 | INHALATION_SPRAY | Freq: Four times a day (QID) | RESPIRATORY_TRACT | 0 refills | Status: AC | PRN
  Filled 2024-04-17: qty 6.7, 25d supply, fill #0

## 2024-04-17 NOTE — Progress Notes (Signed)
 Virtual Visit Consent   [facility], you are scheduled for a virtual visit with a [facility] provider today. Just as with appointments in the office, your consent must be obtained to participate. Your consent will be active for this visit and any virtual visit you may have with one of our providers in the next 365 days. If you have a MyChart account, a copy of this consent can be sent to you electronically.  As this is a virtual visit, video technology does not allow for your provider to perform a traditional examination. This may limit your provider's ability to fully assess your condition. If your provider identifies any concerns that need to be evaluated in person or the need to arrange testing (such as labs, EKG, etc.), we will make arrangements to do so. Although advances in technology are sophisticated, we cannot ensure that it will always work on either your end or our end. If the connection with a video visit is poor, the visit may have to be switched to a telephone visit. With either a video or telephone visit, we are not always able to ensure that we have a secure connection.  By engaging in this virtual visit, you consent to the provision of healthcare and authorize for your insurance to be billed (if applicable) for the services provided during this visit. Depending on your insurance coverage, you may receive a charge related to this service.  I need to obtain your verbal consent now. Are you willing to proceed with your visit today? Brett Henry has provided verbal consent on 04/17/2024 for a virtual visit (video or telephone). Brett CHRISTELLA Dickinson, PA-C  Date: 04/17/2024 9:35 AM   Virtual Visit via Video Note   I, Brett Henry, connected with  [facility]  ([id number], 02-Jan-1991) on 04/17/24 at  9:30 AM EDT by a video-enabled telemedicine application and verified that I am speaking with the correct person using two identifiers.  Location: Patient: Virtual Visit  Location Patient: Home Provider: Virtual Visit Location Provider: Home Office   I discussed the limitations of evaluation and management by telemedicine and the availability of in person appointments. The patient expressed understanding and agreed to proceed.    History of Present Illness: Brett Henry is a 33 y.o. who identifies as a male who was assigned male at birth, and is being seen today for URI and fever.  HPI: URI  This is a new problem. The current episode started in the past 7 days (Started Friday, 04/14/24). The problem has been unchanged. Maximum temperature: low grade, 100.1. The fever has been present for 3 to 4 days. Associated symptoms include congestion, coughing (productive of mucus), diarrhea, headaches, rhinorrhea (and post nasal drainage), sinus pain and wheezing. Pertinent negatives include no ear pain, nausea, plugged ear sensation, sore throat or vomiting. Associated symptoms comments: Chills and sweats, fatigue, body aches. He has tried nothing for the symptoms. The treatment provided no relief.   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   Routine general medical examination at a health care facility 04/27/2019    Allergies:  Allergies  Allergen Reactions   Tylenol [Acetaminophen] Anaphylaxis   Medications:  Current Outpatient Medications:    albuterol  (VENTOLIN  HFA) 108 (90 Base) MCG/ACT inhaler, Inhale 1-2 puffs into the lungs every 6 (six) hours as needed., Disp: 8 g, Rfl: 0  brompheniramine-pseudoephedrine-DM 30-2-10 MG/5ML syrup, Take 5 mLs by mouth 4 (four) times daily as needed., Disp: 120 mL, Rfl: 0   fluticasone  (FLONASE ) 50 MCG/ACT nasal spray, Place 2 sprays into both nostrils daily., Disp: 16 g, Rfl: 0   Cholecalciferol  (VITAMIN D -3) 125 MCG (5000 UT) TABS, Take 1 tablet by mouth daily., Disp: 90 tablet, Rfl:  3   cyclobenzaprine  (FLEXERIL ) 10 MG tablet, Take 1 tablet (10 mg total) by mouth at bedtime., Disp: 30 tablet, Rfl: 1   hyoscyamine  (LEVSIN  SL) 0.125 MG SL tablet, Place 1 tablet (0.125 mg total) under the tongue every 6 (six) hours as needed., Disp: 30 tablet, Rfl: 2   naproxen  (NAPROSYN ) 500 MG tablet, Take 1 tablet (500 mg total) by mouth 2 (two) times daily., Disp: 30 tablet, Rfl: 0   pantoprazole  (PROTONIX ) 40 MG tablet, Take 1 tablet (40 mg total) by mouth 2 (two) times daily., Disp: 180 tablet, Rfl: 1  Observations/Objective: Patient is well-developed, well-nourished in no acute distress.  Resting comfortably at home.  Head is normocephalic, atraumatic.  No labored breathing.  Speech is clear and coherent with logical content.  Patient is alert and oriented at baseline.    Assessment and Plan: 1. Viral URI with cough (Primary) - brompheniramine-pseudoephedrine-DM 30-2-10 MG/5ML syrup; Take 5 mLs by mouth 4 (four) times daily as needed.  Dispense: 120 mL; Refill: 0 - albuterol  (VENTOLIN  HFA) 108 (90 Base) MCG/ACT inhaler; Inhale 1-2 puffs into the lungs every 6 (six) hours as needed.  Dispense: 8 g; Refill: 0 - fluticasone  (FLONASE ) 50 MCG/ACT nasal spray; Place 2 sprays into both nostrils daily.  Dispense: 16 g; Refill: 0  - Suspect viral URI - Symptomatic medications of choice over the counter as needed - Added Albuterol  inhaler, flonase , and Bromfed DM for symptomatic management - Push fluids - Rest - Work Note provided - Seek further evaluation if symptoms change or worsen  Follow Up Instructions: I discussed the assessment and treatment plan with the patient. The patient was provided an opportunity to ask questions and all were answered. The patient agreed with the plan and demonstrated an understanding of the instructions.  A copy of instructions were sent to the patient via MyChart unless otherwise noted below.    The patient was advised to call back or seek an  in-person evaluation if the symptoms worsen or if the condition fails to improve as anticipated.    Brett CHRISTELLA Dickinson, PA-C

## 2024-04-17 NOTE — Patient Instructions (Signed)
 [place], thank you for joining [place] CHRISTELLA Dickinson, PA-C for today's virtual visit.  While this provider is not your primary care provider (PCP), if your PCP is located in our provider database this encounter information will be shared with them immediately following your visit.   A [facility] MyChart account gives you access to today's visit and all your visits, tests, and labs performed at [facility]  click here if you don't have a [facility] MyChart account or go to [email or web address]  Consent: (Patient) Brett Henry provided verbal consent for this virtual visit at the beginning of the encounter.  Current Medications:  Current Outpatient Medications:    albuterol  (VENTOLIN  HFA) 108 (90 Base) MCG/ACT inhaler, Inhale 1-2 puffs into the lungs every 6 (six) hours as needed., Disp: 8 g, Rfl: 0   brompheniramine-pseudoephedrine-DM 30-2-10 MG/5ML syrup, Take 5 mLs by mouth 4 (four) times daily as needed., Disp: 120 mL, Rfl: 0   fluticasone  (FLONASE ) 50 MCG/ACT nasal spray, Place 2 sprays into both nostrils daily., Disp: 16 g, Rfl: 0   Cholecalciferol  (VITAMIN D -3) 125 MCG (5000 UT) TABS, Take 1 tablet by mouth daily., Disp: 90 tablet, Rfl: 3   cyclobenzaprine  (FLEXERIL ) 10 MG tablet, Take 1 tablet (10 mg total) by mouth at bedtime., Disp: 30 tablet, Rfl: 1   hyoscyamine  (LEVSIN  SL) 0.125 MG SL tablet, Place 1 tablet (0.125 mg total) under the tongue every 6 (six) hours as needed., Disp: 30 tablet, Rfl: 2   naproxen  (NAPROSYN ) 500 MG tablet, Take 1 tablet (500 mg total) by mouth 2 (two) times daily., Disp: 30 tablet, Rfl: 0   pantoprazole  (PROTONIX ) 40 MG tablet, Take 1 tablet (40 mg total) by mouth 2 (two) times daily., Disp: 180 tablet, Rfl: 1   Medications ordered in this encounter:  Meds ordered this encounter  Medications   brompheniramine-pseudoephedrine-DM 30-2-10 MG/5ML syrup    Sig: Take 5 mLs by mouth 4 (four) times daily as needed.    Dispense:   120 mL    Refill:  0    Supervising Provider:   LAMPTEY, PHILIP O [[id number]]   albuterol  (VENTOLIN  HFA) 108 (90 Base) MCG/ACT inhaler    Sig: Inhale 1-2 puffs into the lungs every 6 (six) hours as needed.    Dispense:  8 g    Refill:  0    Supervising Provider:   LAMPTEY, PHILIP O [id number]   fluticasone  (FLONASE ) 50 MCG/ACT nasal spray    Sig: Place 2 sprays into both nostrils daily.    Dispense:  16 g    Refill:  0    Supervising Provider:   BLAISE ALEENE KIDD [[id number]]     *If you need refills on other medications prior to your next appointment, please contact your pharmacy*  Follow-Up: Call back or seek an in-person evaluation if the symptoms worsen or if the condition fails to improve as anticipated.  [facility] [phone]  Other Instructions  Viral Respiratory Infection A respiratory infection is an illness that affects part of the respiratory system, such as the lungs, nose, or throat. A respiratory infection that is caused by a virus is called a viral respiratory infection. Common types of viral respiratory infections include: A cold. The flu (influenza). A respiratory syncytial virus (RSV) infection. What are the causes? This condition is caused by a virus. The virus may spread through contact with droplets or direct contact with infected people or their mucus or secretions. The virus may spread from person to  person (is contagious). What are the signs or symptoms? Symptoms of this condition include: A stuffy or runny nose. A sore throat or cough. Shortness of breath or difficulty breathing. Yellow or green mucus (sputum). Other symptoms may include: A fever. Sweating or chills. Fatigue. Achy muscles. A headache. How is this diagnosed? This condition may be diagnosed based on: Your symptoms. A physical exam. Testing of secretions from the nose or throat. Chest X-ray. How is this treated? This condition may be treated with medicines, such  as: Antiviral medicine. This may shorten the length of time a person has symptoms. Expectorants. These make it easier to cough up mucus. Decongestant nasal sprays. Acetaminophen or NSAIDs, such as ibuprofen , to relieve fever and pain. Antibiotic medicines are not prescribed for viral infections.This is because antibiotics are designed to kill bacteria. They do not kill viruses. Follow these instructions at home: Managing pain and congestion Take over-the-counter and prescription medicines only as told by your health care provider. If you have a sore throat, gargle with a mixture of salt and water 3-4 times a day or as needed. To make salt water, completely dissolve -1 tsp (3-6 g) of salt in 1 cup (237 mL) of warm water. Use nose drops made from salt water to ease congestion and soften raw skin around your nose. Take 2 tsp (10 mL) of honey at bedtime to lessen coughing at night. Do not give honey to children who are younger than 1 year. Drink enough fluid to keep your urine pale yellow. This helps prevent dehydration and helps loosen up mucus. General instructions  Rest as much as possible. Do not drink alcohol. Do not use any products that contain nicotine or tobacco. These products include cigarettes, chewing tobacco, and vaping devices, such as e-cigarettes. If you need help quitting, ask your health care provider. Keep all follow-up visits. This is important. How is this prevented?     Get an annual flu shot. You may get the flu shot in late summer, fall, or winter. Ask your health care provider when you should get your flu shot. Avoid spreading your infection to other people. If you are sick: Wash your hands with soap and water often, especially after you cough or sneeze. Wash for at least 20 seconds. If soap and water are not available, use alcohol-based hand sanitizer. Cover your mouth when you cough. Cover your nose and mouth when you sneeze. Do not share cups or eating  utensils. Clean commonly used objects often. Clean commonly touched surfaces. Stay home from work or school as told by your health care provider. Avoid contact with people who are sick during cold and flu season. This is generally fall and winter. Contact a health care provider if: Your symptoms last for 10 days or longer. Your symptoms get worse over time. You have severe sinus pain in your face or forehead. The glands in your jaw or neck become very swollen. You have shortness of breath. Get help right away if you: Feel pain or pressure in your chest. Have trouble breathing. Faint or feel like you will faint. Have severe and persistent vomiting. Feel confused or disoriented. These symptoms may represent a serious problem that is an emergency. Do not wait to see if the symptoms will go away. Get medical help right away. Call your local emergency services (911 in the [place] Do not drive yourself to the hospital. Summary A respiratory infection is an illness that affects part of the respiratory system, such as the lungs,  nose, or throat. A respiratory infection that is caused by a virus is called a viral respiratory infection. Common types of viral respiratory infections include a cold, influenza, and respiratory syncytial virus (RSV) infection. Symptoms of this condition include a stuffy or runny nose, cough, fatigue, achy muscles, sore throat, and fevers or chills. Antibiotic medicines are not prescribed for viral infections. This is because antibiotics are designed to kill bacteria. They are not effective against viruses. This information is not intended to replace advice given to you by your health care provider. Make sure you discuss any questions you have with your health care provider. Document Revised: 10/31/2020 Document Reviewed: 10/31/2020 Elsevier Patient Education  2024 Elsevier Inc.   If you have been instructed to have an in-person evaluation today at a local Urgent Care  facility, please use the link below. It will take you to a list of all of our available [facility], including address, phone number and hours of operation. Please do not delay care.  [facility]  If you or a family member do not have a primary care provider, use the link below to schedule a visit and establish care. When you choose a [facility] primary care physician or advanced practice provider, you gain a long-term partner in health. Find a Primary Care Provider  Learn more about [facility] in-office and virtual care options: [facility] - Get Care Now

## 2024-04-18 ENCOUNTER — Other Ambulatory Visit (HOSPITAL_COMMUNITY): Payer: Self-pay

## 2024-04-18 ENCOUNTER — Ambulatory Visit (INDEPENDENT_AMBULATORY_CARE_PROVIDER_SITE_OTHER): Admitting: Adult Health

## 2024-04-18 VITALS — BP 110/80 | HR 51 | Temp 98.0°F | Ht 65.0 in | Wt 165.0 lb

## 2024-04-18 DIAGNOSIS — J069 Acute upper respiratory infection, unspecified: Secondary | ICD-10-CM | POA: Diagnosis not present

## 2024-04-18 DIAGNOSIS — M545 Low back pain, unspecified: Secondary | ICD-10-CM

## 2024-04-18 DIAGNOSIS — G8929 Other chronic pain: Secondary | ICD-10-CM | POA: Diagnosis not present

## 2024-04-18 LAB — POCT INFLUENZA A/B: Influenza A, POC: NEGATIVE

## 2024-04-18 LAB — POC COVID19 BINAXNOW: SARS Coronavirus 2 Ag: NEGATIVE

## 2024-04-18 MED ORDER — CYCLOBENZAPRINE HCL 10 MG PO TABS
10.0000 mg | ORAL_TABLET | Freq: Three times a day (TID) | ORAL | 0 refills | Status: DC | PRN
  Filled 2024-04-18 – 2024-05-02 (×2): qty 30, 10d supply, fill #0
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
## 2024-04-27 ENCOUNTER — Other Ambulatory Visit (HOSPITAL_COMMUNITY): Payer: Self-pay

## 2024-05-02 ENCOUNTER — Other Ambulatory Visit (HOSPITAL_COMMUNITY): Payer: Self-pay

## 2024-05-26 ENCOUNTER — Other Ambulatory Visit (HOSPITAL_COMMUNITY): Payer: Self-pay

## 2024-05-26 ENCOUNTER — Other Ambulatory Visit: Payer: Self-pay | Admitting: Adult Health

## 2024-05-26 DIAGNOSIS — G8929 Other chronic pain: Secondary | ICD-10-CM

## 2024-05-26 MED ORDER — CYCLOBENZAPRINE HCL 10 MG PO TABS
10.0000 mg | ORAL_TABLET | Freq: Three times a day (TID) | ORAL | 0 refills | Status: AC | PRN
  Filled 2024-05-26 – 2024-06-23 (×2): qty 30, 10d supply, fill #0

## 2024-06-06 ENCOUNTER — Other Ambulatory Visit (HOSPITAL_COMMUNITY): Payer: Self-pay

## 2024-06-23 ENCOUNTER — Other Ambulatory Visit (HOSPITAL_COMMUNITY): Payer: Self-pay

## 2024-07-04 ENCOUNTER — Other Ambulatory Visit (HOSPITAL_COMMUNITY): Payer: Self-pay

## 2024-08-08 ENCOUNTER — Telehealth
